# Patient Record
Sex: Female | Born: 1970 | Race: Black or African American | Hispanic: No | Smoking: Never smoker
Health system: Southern US, Community
[De-identification: ages and names within clinical notes are randomized; demographics above are authoritative.]

## PROBLEM LIST (undated history)

## (undated) DIAGNOSIS — Z90711 Acquired absence of uterus with remaining cervical stump: Secondary | ICD-10-CM

## (undated) HISTORY — PX: TOTAL ABDOMINAL HYSTERECTOMY: SHX209

## (undated) HISTORY — DX: Acquired absence of uterus with remaining cervical stump: Z90.711

## (undated) HISTORY — PX: ABDOMINAL HYSTERECTOMY: SHX81

---

## 1998-06-04 ENCOUNTER — Other Ambulatory Visit: Admission: RE | Admit: 1998-06-04 | Discharge: 1998-06-04 | Payer: Self-pay | Admitting: *Deleted

## 1999-06-15 ENCOUNTER — Other Ambulatory Visit: Admission: RE | Admit: 1999-06-15 | Discharge: 1999-06-15 | Payer: Self-pay | Admitting: Obstetrics and Gynecology

## 1999-08-24 ENCOUNTER — Encounter: Payer: Self-pay | Admitting: *Deleted

## 1999-08-24 ENCOUNTER — Ambulatory Visit (HOSPITAL_COMMUNITY): Admission: RE | Admit: 1999-08-24 | Discharge: 1999-08-24 | Payer: Self-pay | Admitting: *Deleted

## 2000-03-23 ENCOUNTER — Inpatient Hospital Stay (HOSPITAL_COMMUNITY): Admission: AD | Admit: 2000-03-23 | Discharge: 2000-03-23 | Payer: Self-pay | Admitting: Obstetrics and Gynecology

## 2000-03-26 ENCOUNTER — Inpatient Hospital Stay (HOSPITAL_COMMUNITY): Admission: AD | Admit: 2000-03-26 | Discharge: 2000-03-29 | Payer: Self-pay | Admitting: Obstetrics and Gynecology

## 2000-05-08 ENCOUNTER — Other Ambulatory Visit: Admission: RE | Admit: 2000-05-08 | Discharge: 2000-05-08 | Payer: Self-pay | Admitting: Obstetrics and Gynecology

## 2001-07-02 ENCOUNTER — Other Ambulatory Visit: Admission: RE | Admit: 2001-07-02 | Discharge: 2001-07-02 | Payer: Self-pay | Admitting: Obstetrics and Gynecology

## 2002-08-08 ENCOUNTER — Other Ambulatory Visit: Admission: RE | Admit: 2002-08-08 | Discharge: 2002-08-08 | Payer: Self-pay | Admitting: Obstetrics and Gynecology

## 2003-10-15 ENCOUNTER — Other Ambulatory Visit: Admission: RE | Admit: 2003-10-15 | Discharge: 2003-10-15 | Payer: Self-pay | Admitting: Obstetrics and Gynecology

## 2004-10-15 ENCOUNTER — Other Ambulatory Visit: Admission: RE | Admit: 2004-10-15 | Discharge: 2004-10-15 | Payer: Self-pay | Admitting: Obstetrics and Gynecology

## 2005-11-08 ENCOUNTER — Other Ambulatory Visit: Admission: RE | Admit: 2005-11-08 | Discharge: 2005-11-08 | Payer: Self-pay | Admitting: Obstetrics and Gynecology

## 2007-02-14 ENCOUNTER — Other Ambulatory Visit: Admission: RE | Admit: 2007-02-14 | Discharge: 2007-02-14 | Payer: Self-pay | Admitting: Obstetrics and Gynecology

## 2007-08-14 ENCOUNTER — Other Ambulatory Visit: Admission: RE | Admit: 2007-08-14 | Discharge: 2007-08-14 | Payer: Self-pay | Admitting: Nurse Practitioner

## 2008-02-13 ENCOUNTER — Other Ambulatory Visit: Admission: RE | Admit: 2008-02-13 | Discharge: 2008-02-13 | Payer: Self-pay | Admitting: Obstetrics and Gynecology

## 2008-03-04 ENCOUNTER — Encounter: Admission: RE | Admit: 2008-03-04 | Discharge: 2008-03-04 | Payer: Self-pay | Admitting: Obstetrics and Gynecology

## 2009-06-11 ENCOUNTER — Other Ambulatory Visit: Admission: RE | Admit: 2009-06-11 | Discharge: 2009-06-11 | Payer: Self-pay | Admitting: Obstetrics and Gynecology

## 2009-10-05 ENCOUNTER — Emergency Department (HOSPITAL_COMMUNITY): Admission: EM | Admit: 2009-10-05 | Discharge: 2009-10-06 | Payer: Self-pay | Admitting: Emergency Medicine

## 2009-10-05 ENCOUNTER — Encounter: Admission: RE | Admit: 2009-10-05 | Discharge: 2009-10-05 | Payer: Self-pay | Admitting: Family Medicine

## 2009-10-09 ENCOUNTER — Encounter (INDEPENDENT_AMBULATORY_CARE_PROVIDER_SITE_OTHER): Payer: Self-pay | Admitting: Obstetrics and Gynecology

## 2009-10-09 ENCOUNTER — Inpatient Hospital Stay (HOSPITAL_COMMUNITY): Admission: RE | Admit: 2009-10-09 | Discharge: 2009-10-11 | Payer: Self-pay | Admitting: Obstetrics and Gynecology

## 2010-06-05 LAB — BASIC METABOLIC PANEL
BUN: 4 mg/dL — ABNORMAL LOW (ref 6–23)
CO2: 26 mEq/L (ref 19–32)
Calcium: 8.8 mg/dL (ref 8.4–10.5)
Chloride: 109 mEq/L (ref 96–112)
Creatinine, Ser: 0.99 mg/dL (ref 0.4–1.2)
GFR calc Af Amer: >60 mL/min (ref >60)
GFR calc non Af Amer: >60 mL/min (ref >60)
Glucose, Bld: 89 mg/dL (ref 70–99)
Potassium: 3.9 mEq/L (ref 3.5–5.1)
Sodium: 141 mEq/L (ref 135–145)

## 2010-06-05 LAB — URINALYSIS, ROUTINE W REFLEX MICROSCOPIC
Bilirubin Urine: NEGATIVE
Bilirubin Urine: NEGATIVE
Glucose, UA: NEGATIVE mg/dL
Hgb urine dipstick: NEGATIVE
Hgb urine dipstick: NEGATIVE
Ketones, ur: NEGATIVE mg/dL
Specific Gravity, Urine: >1.046 — ABNORMAL HIGH (ref 1.005–1.030)
Urobilinogen, UA: 1 mg/dL (ref 0.0–1.0)
pH: 6.5 (ref 5.0–8.0)
pH: 8.5 — ABNORMAL HIGH (ref 5.0–8.0)

## 2010-06-05 LAB — POCT I-STAT, CHEM 8
Creatinine, Ser: 1.2 mg/dL (ref 0.4–1.2)
Glucose, Bld: 113 mg/dL — ABNORMAL HIGH (ref 70–99)
Hemoglobin: 12.6 g/dL (ref 12.0–15.0)
Potassium: 4 mEq/L (ref 3.5–5.1)

## 2010-06-05 LAB — CBC
HCT: 28.3 % — ABNORMAL LOW (ref 36.0–46.0)
HCT: 34.4 % — ABNORMAL LOW (ref 36.0–46.0)
HCT: 36.4 % (ref 36.0–46.0)
Hemoglobin: 11.9 g/dL — ABNORMAL LOW (ref 12.0–15.0)
Hemoglobin: 12.5 g/dL (ref 12.0–15.0)
Hemoglobin: 9.5 g/dL — ABNORMAL LOW (ref 12.0–15.0)
MCH: 28.9 pg (ref 26.0–34.0)
MCHC: 33.5 g/dL (ref 30.0–36.0)
MCHC: 34.3 g/dL (ref 30.0–36.0)
MCV: 83.5 fL (ref 78.0–100.0)
MCV: 84.3 fL (ref 78.0–100.0)
MCV: 85 fL (ref 78.0–100.0)
Platelets: 12 10*3/uL — CL (ref 150–400)
Platelets: 242 10*3/uL (ref 150–400)
RBC: 4.12 MIL/uL (ref 3.87–5.11)
RBC: 4.32 MIL/uL (ref 3.87–5.11)
RDW: 13.5 % (ref 11.5–15.5)
WBC: 2.2 10*3/uL — ABNORMAL LOW (ref 4.0–10.5)
WBC: 3.9 10*3/uL — ABNORMAL LOW (ref 4.0–10.5)

## 2010-08-06 NOTE — H&P (Signed)
[facility]  Patient:    Jessica Mccarthy, Jessica Mccarthy                       MRN: [id number] Adm. Date:  [id number] Disc. Date: [id number] Attending:  Shaune Spittle Dictator:   Wynelle Bourgeois, CNM                         History and Physical  DATE OF BIRTH:                [birth year]  HISTORY OF PRESENT ILLNESS:   Jessica Mccarthy is a 40 year old, G2, para 0-0-1-0, at 41-4/7 weeks who presents for induction of labor for postdatism.  She reports rare uterine contractions and no leaking or bleeding, and positive fetal movement.  Pregnancy has been followed by the certified nurse midwife service and has been remarkable for:  1. Positive group B strep.  2. Obesity. 3. Rubella nonimmune.  4. First-trimester bleeding.  5. Condyloma.  PRENATAL LABORATORIES:        Hemoglobin 12.8, hematocrit 37.2, platelets 385. Blood type B positive, antibody screen negative, sickle cell negative, RPR nonreactive, rubella nonimmune.  HBsAg negative, HIV nonreactive.  Glucose challenge within normal limits.  Pap test within normal limits.  Gonorrhea negative, Chlamydia negative.  AFP, free beta within normal limits.  OBSTETRIC HISTORY:            Remarkable for an elective abortion in 1993 at [redacted] weeks gestation with no complications.  PAST MEDICAL HISTORY:         Remarkable for an abnormal Pap smear in 1995 with normal repeated Pap smears.  She reports rare Candida infections.  She was diagnosed with stomach ulcer in 1995 and treated with Tagamet x 1 month. She has a history of broken right index finger in May 2001 treated with immobilization.  PAST SURGICAL HISTORY:        Remarkable for wisdom teeth removed in 1998, elective abortion in 1993, and vulvar biopsy in 1998.  FAMILY HISTORY:               Remarkable for maternal grandmother with hypertension, mother with varicosities.  Genetic history is remarkable for father of the babys first cousin who had twins with  stillbirths.  SOCIAL HISTORY:               Patient is married to [employer] who is involved and supportive.  She is of the episcopal faith.  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  She denies any alcohol, tobacco, or drug use.  PHYSICAL EXAMINATION:  VITAL SIGNS:                  Stable.  HEENT:                        Within normal limits.  NECK:                         Thyroid normal, not enlarged.  Neck supple.  CHEST:                        Clear to auscultation bilaterally.  HEART:                        Regular rate and rhythm.  No  murmur.  BREASTS:                      Soft, nontender.  No masses.  ABDOMEN:                      Gravid at 40 cm, vertex to [place].  EFM shows heart rate of 130s to 140s with accelerations to 150s and 160s, no decelerations.  A few mild uterine contractions are noted.  PELVIC:                       Cervical exam not performed at this time secondary to nursing admission.  EXTREMITIES:                  Within normal limits.  BACK:                         Normal.  ASSESSMENT:                   1. Intrauterine pregnancy at 41-4/7 weeks.                               2. Post dates, induction of labor, elective.                               3. Group B streptococcus positive.  PLAN:                         1. Admit to birthing suites, Dr. Pennie Rushing                                  notified.                               2. Routine certified nurse midwife orders.                               3. Cytotec 25 mcg per vagina q.4h., then Pitocin                                  in a.m.                               4. Group B streptococcus prophylaxis with                                  penicillin (no known drug allergies). DD:  03/26/00 TD:  03/26/00 Job: [id number]

## 2012-12-14 ENCOUNTER — Other Ambulatory Visit: Payer: Self-pay

## 2012-12-14 DIAGNOSIS — Z1231 Encounter for screening mammogram for malignant neoplasm of breast: Secondary | ICD-10-CM

## 2013-01-04 ENCOUNTER — Ambulatory Visit
Admission: RE | Admit: 2013-01-04 | Discharge: 2013-01-04 | Disposition: A | Discharge status: Home / Self Care | Payer: BC Managed Care – PPO | Source: Ambulatory Visit

## 2013-01-04 DIAGNOSIS — Z1231 Encounter for screening mammogram for malignant neoplasm of breast: Secondary | ICD-10-CM

## 2013-08-09 ENCOUNTER — Ambulatory Visit (INDEPENDENT_AMBULATORY_CARE_PROVIDER_SITE_OTHER): Payer: BC Managed Care – PPO | Admitting: Family Medicine

## 2013-08-09 VITALS — BP 122/82 | HR 67 | Temp 98.2°F | Resp 18 | Ht 67.0 in | Wt 196.0 lb

## 2013-08-09 DIAGNOSIS — L239 Allergic contact dermatitis, unspecified cause: Secondary | ICD-10-CM

## 2013-08-09 DIAGNOSIS — L259 Unspecified contact dermatitis, unspecified cause: Secondary | ICD-10-CM

## 2013-08-09 MED ORDER — TRIAMCINOLONE ACETONIDE 0.1 % EX CREA: 1.0000 "application " | TOPICAL_CREAM | Freq: Three times a day (TID) | CUTANEOUS | Status: DC

## 2013-08-09 NOTE — Progress Notes (Signed)
This is a 43 year old woman who is originally from [place]. She comes in with a history of 3 episodes of bumpy, itchy, and dark and upper lip over the last several weeks. She says that she's changed her diet to wait carbohydrates and she's concentrating on fruits, tomatoes, and nuts.  Patient's had a problem with certain foods but that seemed to resolve as she got older.  Patient notes that she has been taking Benadryl to control the itching.  Objective: Mild hyperpigmentation of the upper lip with papular changes. Oropharynx clear. No respiratory distress  Assessment: Inpatient section having a problem with cashews.  Plan: Clinic notes from her diet for the next week and then rechallenged after the triamcinolone treatment that we called in.  Signed, Robyn Haber

## 2013-09-19 ENCOUNTER — Ambulatory Visit (INDEPENDENT_AMBULATORY_CARE_PROVIDER_SITE_OTHER): Payer: BC Managed Care – PPO | Admitting: Physician Assistant

## 2013-09-19 VITALS — BP 102/57 | HR 70 | Temp 98.0°F | Resp 18 | Ht 67.5 in | Wt 199.0 lb

## 2013-09-19 DIAGNOSIS — T7840XA Allergy, unspecified, initial encounter: Secondary | ICD-10-CM

## 2013-09-19 DIAGNOSIS — R21 Rash and other nonspecific skin eruption: Secondary | ICD-10-CM

## 2013-09-19 MED ORDER — CETIRIZINE HCL 10 MG PO TABS: 10.0000 mg | ORAL_TABLET | Freq: Every day | ORAL | Status: DC

## 2013-09-19 NOTE — Patient Instructions (Signed)
Zyrtec (cetirizine) daily in the morning and Benadryl 25-50 mg

## 2013-09-19 NOTE — Progress Notes (Signed)
   Subjective:    Patient ID: Jessica Mccarthy, female    DOB: 08/01/1970, 43 y.o.   MRN: [id number]  HPI 43 year old female presents for evaluation of acute onset of erythematous bumps on her chest. States she first noticed them this morning and so she came in immediately for evaluation. She had a similar reaction last month with lip swelling and bumps. Was prescribed TAC cream to take. She does have an appointment with an allergist on 7/20. Does admit that the bumps on her chest seem to be improving as the day progresses.  No new medications, foods, soaps, detergents, or lotions Denies any hx of allergies.   No OTC treatments tried yet.     Review of Systems  Constitutional: Negative for fever and chills.  Gastrointestinal: Negative for nausea and vomiting.  Skin: Positive for rash.  Neurological: Negative for headaches.       Objective:   Physical Exam  Constitutional: She is oriented to person, place, and time. She appears well-developed and well-nourished.  HENT:  Head: Normocephalic and atraumatic.  Right Ear: External ear normal.  Left Ear: External ear normal.  Eyes: Conjunctivae are normal.  Neck: Normal range of motion.  Cardiovascular: Normal rate.   Pulmonary/Chest: Effort normal.  Neurological: She is alert and oriented to person, place, and time.  Skin:     Psychiatric: She has a normal mood and affect. Her behavior is normal. Judgment and thought content normal.          Assessment & Plan:  Allergic reaction, initial encounter - Plan: cetirizine (ZYRTEC) 10 MG tablet  Rash and nonspecific skin eruption  Limited, local, allergic reaction. Will treat conservatively with TAC cream and antihistamines. Patient has rx at home for TAC cream. Start Zyrtec daily in the morning and Benadryl 25-50 mg at bedtime. Consider prednisone taper if symptoms become more widespread.  F/u with allergist as planned. RTC precautions discussed.

## 2013-12-30 DIAGNOSIS — L7211 Pilar cyst: Secondary | ICD-10-CM | POA: Insufficient documentation

## 2013-12-30 DIAGNOSIS — L669 Cicatricial alopecia, unspecified: Secondary | ICD-10-CM | POA: Insufficient documentation

## 2014-02-19 ENCOUNTER — Other Ambulatory Visit: Payer: Self-pay

## 2014-02-19 DIAGNOSIS — Z1231 Encounter for screening mammogram for malignant neoplasm of breast: Secondary | ICD-10-CM

## 2014-03-04 ENCOUNTER — Ambulatory Visit
Admission: RE | Admit: 2014-03-04 | Discharge: 2014-03-04 | Disposition: A | Discharge status: Home / Self Care | Payer: BC Managed Care – PPO | Source: Ambulatory Visit

## 2014-03-04 ENCOUNTER — Encounter (INDEPENDENT_AMBULATORY_CARE_PROVIDER_SITE_OTHER): Payer: Self-pay

## 2014-03-04 DIAGNOSIS — Z1231 Encounter for screening mammogram for malignant neoplasm of breast: Secondary | ICD-10-CM

## 2014-12-17 ENCOUNTER — Telehealth: Payer: Self-pay

## 2014-12-17 NOTE — Telephone Encounter (Signed)
Spoke with patient. The only records related to rash in breast area were from July 2015. She states that's not what she needs. The rash she's referring to was "under" the breast, not on top. Patient's paper chart from 2011 was pulled and I did not see any records related to breast rash. She understood and did not request any records.

## 2014-12-17 NOTE — Telephone Encounter (Signed)
Patient left voicemail today at 11:46am requesting a call back. She wants to know if we have records from approximately 2 years ago related to a breast rash. If we have records, she needs copies. Please call back at [phone].

## 2015-03-09 ENCOUNTER — Other Ambulatory Visit: Payer: Self-pay

## 2015-03-09 DIAGNOSIS — Z1231 Encounter for screening mammogram for malignant neoplasm of breast: Secondary | ICD-10-CM

## 2015-03-27 ENCOUNTER — Ambulatory Visit
Admission: RE | Admit: 2015-03-27 | Discharge: 2015-03-27 | Disposition: A | Discharge status: Home / Self Care | Payer: BC Managed Care – PPO | Source: Ambulatory Visit

## 2015-03-27 DIAGNOSIS — Z1231 Encounter for screening mammogram for malignant neoplasm of breast: Secondary | ICD-10-CM

## 2015-09-21 ENCOUNTER — Ambulatory Visit (INDEPENDENT_AMBULATORY_CARE_PROVIDER_SITE_OTHER): Payer: BC Managed Care – PPO

## 2015-09-21 ENCOUNTER — Ambulatory Visit (INDEPENDENT_AMBULATORY_CARE_PROVIDER_SITE_OTHER): Payer: BC Managed Care – PPO | Admitting: Family Medicine

## 2015-09-21 VITALS — BP 108/64 | HR 88 | Temp 98.3°F | Resp 18 | Ht 67.72 in | Wt 223.0 lb

## 2015-09-21 DIAGNOSIS — S90212A Contusion of left great toe with damage to nail, initial encounter: Secondary | ICD-10-CM | POA: Diagnosis not present

## 2015-09-21 DIAGNOSIS — S99922A Unspecified injury of left foot, initial encounter: Secondary | ICD-10-CM

## 2015-09-21 NOTE — Patient Instructions (Addendum)
Take 400mg  of Ibuprofen every six hours as needed for pain.  Wear post op shoe for one week.  Come back to clinic as needed.     IF you received an x-ray today, you will receive an invoice from [facility]. Please contact [facility] at [phone] with questions or concerns regarding your invoice.   IF you received labwork today, you will receive an invoice from [employer]. Please contact [facility] at [phone] with questions or concerns regarding your invoice.   Our billing staff will not be able to assist you with questions regarding bills from these companies.  You will be contacted with the lab results as soon as they are available. The fastest way to get your results is to activate your My Chart account. Instructions are located on the last page of this paperwork. If you have not heard from [place] regarding the results in 2 weeks, please contact this office.

## 2015-09-21 NOTE — Progress Notes (Addendum)
Subjective:    Patient ID: Jessica Mccarthy, female    DOB: 1971-03-17, 45 y.o.   MRN: [id number] By signing my name below, I, Judithe Modest, attest that this documentation has been prepared under the direction and in the presence of Delman Cheadle, MD. Electronically Signed: Judithe Modest, ER Scribe. 09/21/2015. 2:38 PM.  Chief Complaint  Patient presents with  . Toe Injury    right great toe, last night    HPI HPI Comments: Jessica Mccarthy is a 45 y.o. female who presents to [facility] complaining of a left first toe injury after hitting her toe on a watermelon. Her toenail is partially pealed up from the bed. She is able to move the toe a little  Past Medical History  Diagnosis Date  . S/P partial hysterectomy    No Known Allergies Current Outpatient Prescriptions on File Prior to Visit  Medication Sig Dispense Refill  . triamcinolone cream (KENALOG) 0.1 % Apply 1 application topically 3 (three) times daily. 60 g 0  . cetirizine (ZYRTEC) 10 MG tablet Take 1 tablet (10 mg total) by mouth daily. (Patient not taking: Reported on 09/21/2015) 90 tablet 1   No current facility-administered medications on file prior to visit.    Review of Systems  Constitutional: Negative for fever and chills.  Cardiovascular: Negative for leg swelling.  Musculoskeletal: Positive for joint swelling, arthralgias and gait problem.  Skin: Negative for color change, rash and wound.  Neurological: Negative for weakness and numbness.  Hematological: Does not bruise/bleed easily.      Objective:  Blood pressure 108/64, pulse 88, temperature 98.3 F (36.8 C), resp. rate 18, height 5' 7.72" (1.72 m), weight 223 lb (101.152 kg), SpO2 100 %.  Physical Exam  Constitutional: She is oriented to person, place, and time. She appears well-developed and well-nourished. No distress.  HENT:  Head: Normocephalic and atraumatic.  Eyes: Pupils are equal, round, and reactive to light.  Neck: Neck supple.  Cardiovascular:  Normal rate.   Pulmonary/Chest: Effort normal. No respiratory distress.  Musculoskeletal: Normal range of motion.  Neurological: She is alert and oriented to person, place, and time. Coordination normal.  Skin: Skin is warm and dry. She is not diaphoretic.  Non-tender over the left first metatarsal on plantar surface. Severe tenderness over the left first IP joint. Able to move the left MTP joint but minimal movement at the IP joint.  Psychiatric: She has a normal mood and affect. Her behavior is normal.  Nursing note and vitals reviewed.    Dg Toe Great Left  09/21/2015  CLINICAL DATA:  Toe injury, jammed last night.  Pain at PIP joint. EXAM: LEFT GREAT TOE COMPARISON:  None. FINDINGS: No acute bony abnormality. Specifically, no fracture, subluxation, or dislocation. Soft tissues are intact. IMPRESSION: No acute bony abnormality. Electronically Signed   By: Rolm Baptise M.D.   On: 09/21/2015 14:50    Assessment & Plan:   1. Toe injury, left, initial encounter   X-ray reassuring normal - jammed toe. Recommend buddy tape and placed in a post-op shoe. Toenail lifted off of base and cracked with abnml nail bed beneath and quite painful so was removed.  Recheck in 7-10d.  Orders Placed This Encounter  Procedures  . DG Toe Great Left    Standing Status: Future     Number of Occurrences: 1     Standing Expiration Date: 09/20/2016    Order Specific Question:  Reason for Exam (SYMPTOM  OR DIAGNOSIS REQUIRED)  Answer:  jammed last night, very tender and no mvmnt at IP joint, nail lifted off    Order Specific Question:  Is the patient pregnant?    Answer:  No    Order Specific Question:  Preferred imaging location?    Answer:  External    I personally performed the services described in this documentation, which was scribed in my presence. The recorded information has been reviewed and considered, and addended by me as needed.   Delman Cheadle, M.D.  [facility] [address] [place], [place] [address] [phone] phone [phone] fax  09/21/2015 7:52 PM

## 2015-09-21 NOTE — Progress Notes (Signed)
PROCEDURE NOTE: Ingrown Toenail Removal  Verbal Consent Obtained. Right hallux cleaned with betadine, then digital block with 5cc of 0.5% Marcaine. Sterile prep and drape. Entire toenail lifted and excised. Proximal aspect of nail bed explored revealing no nail remnants. Ingrown tissue debrided. Excess skin tissue overlying nailbed also debrided. No active bleeding. Xeroform dressing applied. Cleansed and dressed. Wound care instructions including precautions reviewed with patient.  Tenna Delaine, PA-C  [facility] 09/21/2015 4:15 PM

## 2015-09-30 ENCOUNTER — Ambulatory Visit (INDEPENDENT_AMBULATORY_CARE_PROVIDER_SITE_OTHER): Payer: BC Managed Care – PPO | Admitting: Physician Assistant

## 2015-09-30 VITALS — BP 112/68 | HR 82 | Temp 98.3°F | Resp 16

## 2015-09-30 DIAGNOSIS — S99922D Unspecified injury of left foot, subsequent encounter: Secondary | ICD-10-CM

## 2015-09-30 NOTE — Patient Instructions (Addendum)
Fingernail or Toenail Removal, Care After SEEK MEDICAL CARE IF:  You have increased redness or pain at your nail area.  You have increased fluid, blood, or pus coming from your nail area.  There is a bad smell coming from the dressing.  You have a fever.  Your swelling gets worse, or you have swelling that spreads from your finger to your hand or from your toe to your foot.  You have worsening redness that spreads from your finger to your hand or from your toe up to your foot.  Your finger or toe looks blue or black.   Make sure you soak toenail with regular soap and warm water 2-3 times a day. Use bandaid at night or while outside for the next few weeks. If xeroform falls off that is okay but try to keep it on there as long as possible. You can even cut it to form the shape of your nail as your new nail comes in. Thank you for letting me participate in your health and well being.    IF you received an x-ray today, you will receive an invoice from [facility]. Please contact [facility] at [phone] with questions or concerns regarding your invoice.   IF you received labwork today, you will receive an invoice from [employer]. Please contact [facility] at [phone] with questions or concerns regarding your invoice.   Our billing staff will not be able to assist you with questions regarding bills from these companies.  You will be contacted with the lab results as soon as they are available. The fastest way to get your results is to activate your My Chart account. Instructions are located on the last page of this paperwork. If you have not heard from [place] regarding the results in 2 weeks, please contact this office.

## 2015-09-30 NOTE — Progress Notes (Signed)
   Jessica Mccarthy  MRN: [id number] DOB: 06-17-1970  Subjective:  Jessica Mccarthy is a 45 y.o. female seen in office today for follow up on left first toenail removal. Pt was seen on 09/21/15 after hitting her left first toe on a watermelon. The toenail had peeled away from the nailbed prior to presentation. Toenail removal procedure was perfomed and xeroform dressing was applied.Pt was given post op shoe to wear for one week and given wound care instructions. Told to follow up in one week.  Pt has no complaints today. Does note that she has not been washing the toe and has kept the bandage on since the 09/21/15 visit. Has associated tenderness when walking or applying direct pressure to left first toe. Denies fever, pus drainage, or redness.   Review of Systems  Constitutional: Negative for chills.  Musculoskeletal: Positive for gait problem (does note that her gait is being affected becuase she does not want to bear full weight on left foot becuase the left first toe is stil tender).    There are no active problems to display for this patient.   Current Outpatient Prescriptions on File Prior to Visit  Medication Sig Dispense Refill  . cetirizine (ZYRTEC) 10 MG tablet Take 1 tablet (10 mg total) by mouth daily. 90 tablet 1  . triamcinolone cream (KENALOG) 0.1 % Apply 1 application topically 3 (three) times daily. 60 g 0   No current facility-administered medications on file prior to visit.    No Known Allergies  Objective:  BP 112/68 mmHg  Pulse 82  Temp(Src) 98.3 F (36.8 C)  Resp 16  SpO2 100%  Physical Exam  Constitutional: She is oriented to person, place, and time and well-developed, well-nourished, and in no distress.  Pulmonary/Chest: Effort normal.  Musculoskeletal:       Feet:  Neurological: She is alert and oriented to person, place, and time.  Skin: Skin is warm and dry.  Vitals reviewed.   Bandage overlying left hallux was removed. Left hallux was soaked in soap and  warm water for approximately five minutes then dried with a 4x4 gauze.   Assessment and Plan :  1. Toe injury, left, subsequent encounter -Pt instructed to soak toenail in regular soap and warm water 2-3 times a day. Instructed that if xeroform falls off that is okay but do not intentionally pull it off. Pt can even cut it to form the shape of nail as new nail begins to form.  -Also instructed to start bearing weight on left foot and avoid making changes in her gait that allow her to not put pressure on the left foot. She has been educated that making such gait changes can increase risk of potential issues in lower extremities.   Tenna Delaine PA-C  [facility] 09/30/2015 2:12 PM

## 2016-03-18 ENCOUNTER — Ambulatory Visit (INDEPENDENT_AMBULATORY_CARE_PROVIDER_SITE_OTHER): Payer: BC Managed Care – PPO | Admitting: Physician Assistant

## 2016-03-18 VITALS — BP 114/72 | HR 95 | Temp 99.0°F | Resp 18 | Ht 67.72 in | Wt 224.0 lb

## 2016-03-18 DIAGNOSIS — Z1329 Encounter for screening for other suspected endocrine disorder: Secondary | ICD-10-CM

## 2016-03-18 DIAGNOSIS — Z1389 Encounter for screening for other disorder: Secondary | ICD-10-CM

## 2016-03-18 DIAGNOSIS — Z130 Encounter for screening for diseases of the blood and blood-forming organs and certain disorders involving the immune mechanism: Secondary | ICD-10-CM | POA: Diagnosis not present

## 2016-03-18 DIAGNOSIS — B372 Candidiasis of skin and nail: Secondary | ICD-10-CM

## 2016-03-18 DIAGNOSIS — Z0000 Encounter for general adult medical examination without abnormal findings: Secondary | ICD-10-CM | POA: Diagnosis not present

## 2016-03-18 DIAGNOSIS — Z13220 Encounter for screening for lipoid disorders: Secondary | ICD-10-CM | POA: Diagnosis not present

## 2016-03-18 DIAGNOSIS — Z13228 Encounter for screening for other metabolic disorders: Secondary | ICD-10-CM

## 2016-03-18 DIAGNOSIS — Z114 Encounter for screening for human immunodeficiency virus [HIV]: Secondary | ICD-10-CM

## 2016-03-18 LAB — POCT URINALYSIS DIP (MANUAL ENTRY)
BILIRUBIN UA: NEGATIVE
BILIRUBIN UA: NEGATIVE
Blood, UA: NEGATIVE
GLUCOSE UA: NEGATIVE
LEUKOCYTES UA: NEGATIVE
Nitrite, UA: NEGATIVE
Protein Ur, POC: NEGATIVE
Spec Grav, UA: 1.02
Urobilinogen, UA: 0.2
pH, UA: 6

## 2016-03-18 MED ORDER — NYSTATIN 100000 UNIT/GM EX POWD: Freq: Four times a day (QID) | CUTANEOUS | 1 refills | Status: DC

## 2016-03-18 NOTE — Progress Notes (Signed)
Patient ID: Jessica Mccarthy, female    DOB: [birth year], 45 y.o.   MRN: [id number]  PCP: No PCP Per Patient  Chief Complaint  Patient presents with  . Annual Exam    Subjective:   Presents for [employer].   Cervical Cancer Screening: no longer indicated.  Breast Cancer Screening: 03/2015, scheduled 03/2015; monthly SBE. Colorectal Cancer Screening: not yet a candidate Bone Density Testing: not yet a candidate HIV Screening: not yet STI Screening: separated from her souse. Not currently sexually active. Low risk. Seasonal Influenza Vaccination: current Td/Tdap Vaccination: current by report Pneumococcal Vaccination: not yet a candidate Zoster Vaccination: not yet a candidate Frequency of Dental evaluation: Q6 months Frequency of Eye evaluation: annually, did not bring glasses today    Patient Active Problem List   Diagnosis Date Noted  . 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 S/P partial hysterectomy      Prior to Admission medications   Medication Sig Start Date End Date Taking? Authorizing Provider  amoxicillin (AMOXIL) 500 MG capsule  03/07/16   Historical Provider, MD    No Known Allergies  Past Surgical History:  Procedure Laterality Date  . ABDOMINAL HYSTERECTOMY     due to fibroids    Family History  Problem Relation Age of Onset  . Diabetes Mother   . Hypertension Maternal Grandmother     Social History   Social History  . Marital status: Legally Separated    Spouse name: N/A  . Number of children: 1  . Years of education: College   Occupational History  . 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 Smoking status: Never Smoker  . Smokeless tobacco: Never Used  . Alcohol use 0.0 - 0.6 oz/week  . Drug use: No  . Sexual activity: Yes    Partners: Male    Birth control/ protection: Surgical   Other Topics Concern  . None   Social History Narrative   Daughter  lives with her.   Married 1999. Legally separated 2017.       Review of Systems  Constitutional: Negative.   HENT: Positive for dental problem (crack in root of tooth. DDS is treating.).   Eyes: Negative.   Respiratory: Negative.   Cardiovascular: Negative.   Gastrointestinal: Negative.   Genitourinary: Negative.   Musculoskeletal: Negative.   Skin: Positive for rash (Under RIGHT breast. Burns when showering, movement). Negative for color change, pallor and wound.  Neurological: Negative.   Psychiatric/Behavioral: Negative.         Objective:  Physical Exam  Constitutional: She is oriented to person, place, and time. Vital signs are normal. She appears well-developed and well-nourished. She is active and cooperative. No distress.  BP 114/72 (BP Location: Right Arm, Patient Position: Sitting, Cuff Size: Large)   Pulse 95   Temp 99 F (37.2 C) (Oral)   Resp 18   Ht 5' 7.72" (1.72 m)   Wt 224 lb (101.6 kg)   SpO2 100%   BMI 34.34 kg/m    HENT:  Head: Normocephalic and atraumatic.  Right Ear: Hearing, tympanic membrane, external ear and ear canal normal. No foreign bodies.  Left Ear: Hearing, tympanic membrane, external ear and ear canal normal. No foreign bodies.  Nose: Nose normal.  Mouth/Throat: Uvula is midline, oropharynx is clear and moist and mucous membranes are normal. No oral lesions. Normal dentition. No dental abscesses or uvula swelling. No oropharyngeal  exudate.  Eyes: Conjunctivae, EOM and lids are normal. Pupils are equal, round, and reactive to light. Right eye exhibits no discharge. Left eye exhibits no discharge. No scleral icterus.  Fundoscopic exam:      The right eye shows no arteriolar narrowing, no AV nicking, no exudate, no hemorrhage and no papilledema. The right eye shows red reflex.       The left eye shows no arteriolar narrowing, no AV nicking, no exudate, no hemorrhage and no papilledema. The left eye shows red reflex.  Neck: Trachea normal,  normal range of motion and full passive range of motion without pain. Neck supple. No spinous process tenderness and no muscular tenderness present. No thyroid mass and no thyromegaly present.  Cardiovascular: Normal rate, regular rhythm, normal heart sounds, intact distal pulses and normal pulses.   Pulmonary/Chest: Effort normal and breath sounds normal. Right breast exhibits no inverted nipple, no mass, no nipple discharge, no skin change and no tenderness. Left breast exhibits no inverted nipple, no mass, no nipple discharge, no skin change and no tenderness. Breasts are symmetrical.  Musculoskeletal: She exhibits no edema or tenderness.       Cervical back: Normal.       Thoracic back: Normal.       Lumbar back: Normal.  Lymphadenopathy:       Head (right side): No tonsillar, no preauricular, no posterior auricular and no occipital adenopathy present.       Head (left side): No tonsillar, no preauricular, no posterior auricular and no occipital adenopathy present.    She has no cervical adenopathy.       Right: No supraclavicular adenopathy present.       Left: No supraclavicular adenopathy present.  Neurological: She is alert and oriented to person, place, and time. She has normal strength and normal reflexes. No cranial nerve deficit. She exhibits normal muscle tone. Coordination and gait normal.  Skin: Skin is warm, dry and intact. Rash noted. Rash is maculopapular (under RIGHT breast, consistent with yeast dermatitis ). She is not diaphoretic. No cyanosis or erythema. Nails show no clubbing.  Psychiatric: She has a normal mood and affect. Her speech is normal and behavior is normal. Judgment and thought content normal.           Assessment & Plan:  1. Annual physical exam Age appropriate anticipatory guidance provided.  2. Screening for blood or protein in urine - Urine Microscopic - POCT urinalysis dipstick  3. Screening for deficiency anemia - CBC with  Differential/Platelet  4. Screening for thyroid disorder - TSH  5. Screening for metabolic disorder - Comprehensive metabolic panel  6. Screening for hyperlipidemia - Lipid panel  7. Screening for HIV (human immunodeficiency virus) - HIV antibody  8. Yeast dermatitis Supportive care.  Anticipatory guidance.  RTC if symptoms worsen/persist. - nystatin (NYSTATIN) powder; Apply topically 4 (four) times daily.  Dispense: 30 g; Refill: 1   Fara Chute, PA-C Physician Assistant[facility]Certified [facility] [place]

## 2016-03-18 NOTE — Patient Instructions (Addendum)
   IF you received an x-ray today, you will receive an invoice from [facility]. Please contact [facility] at [phone] with questions or concerns regarding your invoice.   IF you received labwork today, you will receive an invoice from [facility]. Please contact [facility] at [phone] with questions or concerns regarding your invoice.   Our billing staff will not be able to assist you with questions regarding bills from these companies.  You will be contacted with the lab results as soon as they are available. The fastest way to get your results is to activate your My Chart account. Instructions are located on the last page of this paperwork. If you have not heard from us regarding the results in 2 weeks, please contact this office.    Keeping You Healthy  Get These Tests 1. Blood Pressure- Have your blood pressure checked once a year by your health care provider.  Normal blood pressure is 120/80. 2. Weight- Have your body mass index (BMI) calculated to screen for obesity.  BMI is measure of body fat based on height and weight.  You can also calculate your own BMI at www.nhlbisupport.com/bmi/. 3. Cholesterol- Have your cholesterol checked every 5 years starting at age 20 then yearly starting at age 45. 4. Chlamydia, HIV, and other sexually transmitted diseases- Get screened every year until age 25, then within three months of each new sexual provider. 5. Pap Test - Every 1-5 years; discuss with your health care provider. 6. Mammogram- Every 1-2 years starting at age 40--50  Take these medicines  Calcium with Vitamin D-Your body needs 1200 mg of Calcium each day and 800-1000 IU of Vitamin D daily.  Your body can only absorb 500 mg of Calcium at a time so Calcium must be taken in 2 or 3 divided doses throughout the day.  Multivitamin with folic acid- Once daily if it is possible for you to become pregnant.  Get these Immunizations  Gardasil-Series of three doses;  prevents HPV related illness such as genital warts and cervical cancer.  Menactra-Single dose; prevents meningitis.  Tetanus shot- Every 10 years.  Flu shot-Every year.  Take these steps 1. Do not smoke-Your healthcare provider can help you quit.  For tips on how to quit go to www.smokefree.gov or call 1-800 QUITNOW. 2. Be physically active- Exercise 5 days a week for at least 30 minutes.  If you are not already physically active, start slow and gradually work up to 30 minutes of moderate physical activity.  Examples of moderate activity include walking briskly, dancing, swimming, bicycling, etc. 3. Breast Cancer- A self breast exam every month is important for early detection of breast cancer.  For more information and instruction on self breast exams, ask your healthcare provider or www.womenshealth.gov/faq/breast-self-exam.cfm. 4. Eat a healthy diet- Eat a variety of healthy foods such as fruits, vegetables, whole grains, low fat milk, low fat cheeses, yogurt, lean meats, poultry and fish, beans, nuts, tofu, etc.  For more information go to www. Thenutritionsource.org 5. Drink alcohol in moderation- Limit alcohol intake to one drink or less per day. Never drink and drive. 6. Depression- Your emotional health is as important as your physical health.  If you're feeling down or losing interest in things you normally enjoy please talk to your healthcare provider about being screened for depression. 7. Dental visit- Brush and floss your teeth twice daily; visit your dentist twice a year. 8. Eye doctor- Get an eye exam at least every 2 years. 9. Helmet   use- Always wear a helmet when riding a bicycle, motorcycle, rollerblading or skateboarding. 10. Safe sex- If you may be exposed to sexually transmitted infections, use a condom. 11. Seat belts- Seat belts can save your live; always wear one. 12. Smoke/Carbon Monoxide detectors- These detectors need to be installed on the appropriate level of your  home. Replace batteries at least once a year. 13. Skin cancer- When out in the sun please cover up and use sunscreen 15 SPF or higher. 14. Violence- If anyone is threatening or hurting you, please tell your healthcare provider.        

## 2016-03-19 LAB — CBC WITH DIFFERENTIAL/PLATELET
BASOS ABS: 0 10*3/uL (ref 0.0–0.2)
Basos: 0 %
EOS (ABSOLUTE): 0.1 10*3/uL (ref 0.0–0.4)
Eos: 1 %
HEMOGLOBIN: 13.7 g/dL (ref 11.1–15.9)
Hematocrit: 39.6 % (ref 34.0–46.6)
Immature Grans (Abs): 0 10*3/uL (ref 0.0–0.1)
Immature Granulocytes: 0 %
LYMPHS ABS: 1.4 10*3/uL (ref 0.7–3.1)
LYMPHS: 31 %
MCH: 28.5 pg (ref 26.6–33.0)
MCHC: 34.6 g/dL (ref 31.5–35.7)
MCV: 83 fL (ref 79–97)
MONOCYTES: 6 %
Monocytes Absolute: 0.3 10*3/uL (ref 0.1–0.9)
NEUTROS PCT: 62 %
Neutrophils Absolute: 2.6 10*3/uL (ref 1.4–7.0)
Platelets: 437 10*3/uL — ABNORMAL HIGH (ref 150–379)
RBC: 4.8 x10E6/uL (ref 3.77–5.28)
RDW: 13.2 % (ref 12.3–15.4)
WBC: 4.4 10*3/uL (ref 3.4–10.8)

## 2016-03-19 LAB — COMPREHENSIVE METABOLIC PANEL
ALBUMIN: 3.8 g/dL (ref 3.5–5.5)
ALK PHOS: 84 IU/L (ref 39–117)
ALT: 14 IU/L (ref 0–32)
AST: 15 IU/L (ref 0–40)
Albumin/Globulin Ratio: 1.2 (ref 1.2–2.2)
BUN/Creatinine Ratio: 8 — ABNORMAL LOW (ref 9–23)
BUN: 7 mg/dL (ref 6–24)
Bilirubin Total: 0.3 mg/dL (ref 0.0–1.2)
CO2: 26 mmol/L (ref 18–29)
CREATININE: 0.87 mg/dL (ref 0.57–1.00)
Calcium: 9.1 mg/dL (ref 8.7–10.2)
Chloride: 101 mmol/L (ref 96–106)
GFR calc Af Amer: 94 mL/min/{1.73_m2} (ref >59)
GFR calc non Af Amer: 82 mL/min/{1.73_m2} (ref >59)
GLUCOSE: 88 mg/dL (ref 65–99)
Globulin, Total: 3.3 g/dL (ref 1.5–4.5)
Potassium: 5 mmol/L (ref 3.5–5.2)
Sodium: 141 mmol/L (ref 134–144)
Total Protein: 7.1 g/dL (ref 6.0–8.5)

## 2016-03-19 LAB — LIPID PANEL
CHOLESTEROL TOTAL: 190 mg/dL (ref 100–199)
Chol/HDL Ratio: 3.2 ratio units (ref 0.0–4.4)
HDL: 59 mg/dL (ref >39)
LDL CALC: 119 mg/dL — AB (ref 0–99)
TRIGLYCERIDES: 62 mg/dL (ref 0–149)
VLDL CHOLESTEROL CAL: 12 mg/dL (ref 5–40)

## 2016-03-19 LAB — TSH: TSH: 2.27 u[IU]/mL (ref 0.450–4.500)

## 2016-03-19 LAB — URINALYSIS, MICROSCOPIC ONLY
BACTERIA UA: NONE SEEN
Casts: NONE SEEN /lpf
RBC, UA: NONE SEEN /hpf (ref 0–?)

## 2016-03-19 LAB — HIV ANTIBODY (ROUTINE TESTING W REFLEX): HIV Screen 4th Generation wRfx: NONREACTIVE

## 2016-03-20 ENCOUNTER — Encounter: Payer: Self-pay | Admitting: Physician Assistant

## 2016-03-27 ENCOUNTER — Encounter: Payer: Self-pay | Admitting: Physician Assistant

## 2016-05-19 ENCOUNTER — Other Ambulatory Visit: Payer: Self-pay | Admitting: Obstetrics and Gynecology

## 2016-05-19 DIAGNOSIS — Z1231 Encounter for screening mammogram for malignant neoplasm of breast: Secondary | ICD-10-CM

## 2016-06-10 ENCOUNTER — Ambulatory Visit
Admission: RE | Admit: 2016-06-10 | Discharge: 2016-06-10 | Disposition: A | Discharge status: Home / Self Care | Payer: BC Managed Care – PPO | Source: Ambulatory Visit | Attending: Obstetrics and Gynecology | Admitting: Obstetrics and Gynecology

## 2016-06-10 DIAGNOSIS — Z1231 Encounter for screening mammogram for malignant neoplasm of breast: Secondary | ICD-10-CM

## 2017-03-07 ENCOUNTER — Encounter: Payer: Self-pay | Admitting: Physician Assistant

## 2017-03-07 ENCOUNTER — Encounter (HOSPITAL_COMMUNITY): Payer: Self-pay

## 2017-03-07 ENCOUNTER — Ambulatory Visit (HOSPITAL_COMMUNITY)
Admission: RE | Admit: 2017-03-07 | Discharge: 2017-03-07 | Disposition: A | Discharge status: Home / Self Care | Payer: BC Managed Care – PPO | Source: Ambulatory Visit | Attending: Cardiovascular Disease | Admitting: Cardiovascular Disease

## 2017-03-07 ENCOUNTER — Other Ambulatory Visit: Payer: Self-pay

## 2017-03-07 ENCOUNTER — Ambulatory Visit: Payer: BC Managed Care – PPO | Admitting: Physician Assistant

## 2017-03-07 VITALS — BP 108/70 | HR 88 | Temp 98.6°F | Resp 18 | Ht 67.72 in | Wt 240.6 lb

## 2017-03-07 DIAGNOSIS — M7989 Other specified soft tissue disorders: Secondary | ICD-10-CM | POA: Diagnosis not present

## 2017-03-07 DIAGNOSIS — R000 Tachycardia, unspecified: Secondary | ICD-10-CM

## 2017-03-07 DIAGNOSIS — R21 Rash and other nonspecific skin eruption: Secondary | ICD-10-CM | POA: Diagnosis not present

## 2017-03-07 DIAGNOSIS — B372 Candidiasis of skin and nail: Secondary | ICD-10-CM

## 2017-03-07 LAB — D-DIMER, QUANTITATIVE: D-DIMER: 0.38 mg/L FEU (ref 0.00–0.49)

## 2017-03-07 MED ORDER — NYSTATIN 100000 UNIT/GM EX POWD: Freq: Three times a day (TID) | CUTANEOUS | 1 refills | Status: DC

## 2017-03-07 MED ORDER — FLUCONAZOLE 150 MG PO TABS: 150.0000 mg | ORAL_TABLET | ORAL | 0 refills | Status: AC

## 2017-03-07 NOTE — Patient Instructions (Addendum)
Your ultrasound is scheduled today at [place]. The office is located in the building where [employer] is. Please take the elevators up to the second floor and proceed to [address]. Arrive by 2:45pm.   Your rash is consistent with candidiasis.  I recommend applying nystatin 3 times a day.  I have also given you oral tablet to take once weekly for the next 2 weeks.  If your rash persist beyond 2 weeks, please return to clinic.  Try to keep this area as dry and Aridol as much as possible.  Below some information about candidiasis.  In terms of your lower leg swelling, we are going to send you for stat ultrasound to rule out any blood clot.  I have also collected a lab and should have that back in a few hours.  We will contact you with the results of these tests and discuss further treatment.  If you develop any chest pain, shortness of breath, or calf pain before 3pm, please go to the emergency room immediately.   Skin Yeast Infection Skin yeast infection is a condition in which there is an overgrowth of yeast (candida) that normally lives on the skin. This condition usually occurs in areas of the skin that are constantly warm and moist, such as the armpits or the groin. What are the causes? This condition is caused by a change in the normal balance of the yeast and bacteria that live on the skin. What increases the risk? This condition is more likely to develop in:  People who are obese.  Pregnant women.  Women who take birth control pills.  People who have diabetes.  People who take antibiotic medicines.  People who take steroid medicines.  People who are malnourished.  People who have a weak defense (immune) system.  People who are 44 years of age or older.  What are the signs or symptoms? Symptoms of this condition include:  A red, swollen area of the skin.  Bumps on the skin.  Itchiness.  How is this diagnosed? This condition is diagnosed with a medical history  and physical exam. Your health care provider may check for yeast by taking light scrapings of the skin to be viewed under a microscope. How is this treated? This condition is treated with medicine. Medicines may be prescribed or be available over-the-counter. The medicines may be:  Taken by mouth (orally).  Applied as a cream.  Follow these instructions at home:  Take or apply over-the-counter and prescription medicines only as told by your health care provider.  Eat more yogurt. This may help to keep your yeast infection from returning.  Maintain a healthy weight. If you need help losing weight, talk with your health care provider.  Keep your skin clean and dry.  If you have diabetes, keep your blood sugar under control. Contact a health care provider if:  Your symptoms go away and then return.  Your symptoms do not get better with treatment.  Your symptoms get worse.  Your rash spreads.  You have a fever or chills.  You have new symptoms.  You have new warmth or redness of your skin. This information is not intended to replace advice given to you by your health care provider. Make sure you discuss any questions you have with your health care provider. Document Released: 11/23/2010 Document Revised: 11/01/2015 Document Reviewed: 09/08/2014 Elsevier Interactive Patient Education  2018 [employer].    IF you received an x-ray today, you will receive  an Pharmacologist from [facility]. Please contact [facility] at [phone] with questions or concerns regarding your invoice.   IF you received labwork today, you will receive an invoice from [facility]. Please contact [facility] at [phone] with questions or concerns regarding your invoice.   Our billing staff will not be able to assist you with questions regarding bills from these companies.  You will be contacted with the lab results as soon as they are available. The fastest way to get your results is to  activate your My Chart account. Instructions are located on the last page of this paperwork. If you have not heard from [place] regarding the results in 2 weeks, please contact this office.

## 2017-03-07 NOTE — Progress Notes (Deleted)
Subjective:     Jessica Mccarthy is a 46 y.o. female who presents for evaluation of a rash involving the {body part:32401}. Rash started {1-10:13787} {time; units:19136} ago. Lesions are {color:5006}, and {text:16500} in texture. Rash {has/not:18111} changed over time. Rash {rash dc:16501}. Associated symptoms: {rash assoc:16502}. Patient denies: {rash assoc:19565}. Patient {has/not:18111} had contacts with similar rash. Patient {has/not:18111} had new exposures (soaps, lotions, laundry detergents, foods, medications, plants, insects or animals).  {Common ambulatory SmartLinks:19316}  Review of Systems {ros; complete:30496}    Objective:    BP 108/70   Pulse (!) 111   Temp 98.6 F (37 C) (Oral)   Resp 18   Ht 5' 7.72" (1.72 m)   Wt 240 lb 9.6 oz (109.1 kg)   SpO2 100%   BMI 36.89 kg/m  General:  {gen appearance:16600}  Skin:  {skin exam:30902::"normal"}     Assessment:    {derm diagnosis:16511}    Plan:    {HRCB:63845}

## 2017-03-07 NOTE — Progress Notes (Signed)
Jessica Mccarthy  MRN: [id number] DOB: [birth year]  Subjective:     Jessica Mccarthy is a 46 y.o. female who presents for evaluation of a rash involving the skin underneath bilateral breasts. Rash started 3 days ago. She was in [place] this past week and got really hot walking around and noticed it after that. Lesions are dark, and flat in texture. Rash has changed over time. Rash is pruritic. Associated symptoms: none. Patient denies: abdominal pain, arthralgia, congestion, cough, decrease in appetite, decrease in energy level, fever, headache, irritability, myalgia, nausea, sore throat and vomiting. Patient has not had contacts with similar rash. Patient has not had new exposures (soaps, lotions, laundry detergents, foods, medications, plants, insects or animals). Has tried topical nystatin powder for the past 2 days with no full relief.   Also would like to talk about lower feet swelling. Went to [place] this past week. Had swelling in both feet once she got of the 15 hour flight. It stayed the same while she was there. She got back yesterday and it is still present. The left foot is worse than the right.  She denies calf pain, swelling, redness, warmth, chest pain, and SOB. Has been drinking at least 2 bottles of 16 oz water a day. Denies use of hormones, recent hospitalizations, smoking, and hx of cancer. NO hx of DVT.   Review of Systems  Constitutional: Negative for activity change, appetite change and diaphoresis.  Respiratory: Negative for cough and chest tightness.   Cardiovascular: Negative for palpitations.  Gastrointestinal: Negative for abdominal pain, nausea and vomiting.  Neurological: Negative for dizziness and light-headedness.      Patient Active Problem List   Diagnosis Date Noted  . 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   No current facility-administered medications on file prior to visit.     No Known Allergies    Social History   Socioeconomic History  . Marital status: Legally Separated    Spouse name: Not on file  . Number of children: 1  . Years of education: College  . Highest education level: Not on file  Social Needs  . Financial resource strain: Not on file  . Food insecurity - worry: Not on file  . Food insecurity - inability: Not on file  . Transportation needs - medical: Not on file  . Transportation needs - non-medical: Not on file  Occupational History  . Occupation: Technical brewer: [employer]    Comment: [employer]  Tobacco Use  . Smoking status: Never Smoker  . Smokeless tobacco: Never Used  Substance and Sexual Activity  . Alcohol use: Yes    Alcohol/week: 0.0 - 0.6 oz  . Drug use: No  . Sexual activity: Yes    Partners: Male    Birth control/protection: Surgical  Other Topics Concern  . Not on file  Social History Narrative   Daughter lives with her.   Married 1999. Legally separated 2017.    Objective:  BP 108/70   Pulse 88   Temp 98.6 F (37 C) (Oral)   Resp 18   Ht 5' 7.72" (1.72 m)   Wt 240 lb 9.6 oz (109.1 kg)   SpO2 100%   BMI 36.89 kg/m   Physical Exam  Constitutional: She is oriented to person, place, and time and well-developed, well-nourished, and in no distress.  HENT:  Head: Normocephalic and atraumatic.  Eyes: Conjunctivae are normal.  Neck: Normal range of motion.  Cardiovascular: Regular rhythm and normal heart sounds. Tachycardia present.  Pulmonary/Chest: Effort normal.  Large breasts noted bilaterally.  Musculoskeletal:       Right lower leg: She exhibits edema (1+ edema to mid shin). She exhibits no tenderness.       Left lower leg: She exhibits edema (2 + pitting edema to knee). She exhibits no tenderness.  Left calf measures 50 cm Right calf measures 49.5 cm  Negative Homan's sign bilaterally. No redness or warmth noted bilaterally.     Neurological: She is alert and oriented to person, place, and time. Gait  normal.  Skin: Skin is warm and dry. Rash ( Erythematous macular rash with slight maceration of inframammary folds bilaterally, satellite lesions noted.) noted.     Psychiatric: Affect normal.  Vitals reviewed.   Pulse while ambulating was 114-116 bpm.  Pulse while sitting was 88 bpm.   EKG shows NSR with rate of 77 bpm. PR and QRS intervals within normal limits. Nonspecific inverted T wave noted in lead III. No prior EKG for comparison.    Assessment and Plan :  1. Swelling of lower leg Due to recent long airplane flight with new onset bilateral lower extremity edema (L>R), will obtain stat d-dimer and bilateral lower extremity ultrasound to rule out DVT and PE.Marland Kitchen  Patient was tachycardic when ambulating.  However, resting pulse is 88 bpm and EKG is normal except for nonspecific inverted T wave in lead III.  She denies any calf pain, chest pain, or shortness of breath.  She is well-appearing.  Stat ultrasound controlled for later this afternoon.  Patient informed that if she develops any chest pain, shortness of breath, or calf pain while awaiting to have the ultrasounds, to seek care immediately at the at the ED.  We will discuss further treatment plan once results return. - D-dimer, quantitative (not at [facility]) - VAS [place] LOWER EXTREMITY VENOUS (DVT); Future  2. Tachycardia - EKG 12-Lead  3. Rash and nonspecific skin eruption 4. Candidiasis, intertrigo Rash consistent with candidiasis.  Will treat accordingly.  Given educational material on skin yeast infection.  Encouraged patient to try and keep the affected area as dry as possible. - fluconazole (DIFLUCAN) 150 MG tablet; Take 1 tablet (150 mg total) by mouth once a week for 2 doses.  Dispense: 2 tablet; Refill: 0 - nystatin (NYSTATIN) powder; Apply topically 3 (three) times daily.  Dispense: 30 g; Refill: [place] PA-C  Primary Care at [place] 03/09/2017 9:06 PM

## 2017-03-07 NOTE — Progress Notes (Unsigned)
Today's lower extremity venous duplex is negative for DVT. There is a Baker's cyst behind both knees. Preliminary results given to [place].

## 2017-03-09 ENCOUNTER — Encounter: Payer: Self-pay | Admitting: Physician Assistant

## 2017-04-06 ENCOUNTER — Ambulatory Visit: Payer: BC Managed Care – PPO | Admitting: Physician Assistant

## 2017-04-06 ENCOUNTER — Encounter: Payer: Self-pay | Admitting: Physician Assistant

## 2017-04-06 ENCOUNTER — Other Ambulatory Visit: Payer: Self-pay

## 2017-04-06 VITALS — BP 118/72 | HR 98 | Temp 99.1°F | Resp 18 | Ht 66.73 in | Wt 236.8 lb

## 2017-04-06 DIAGNOSIS — R21 Rash and other nonspecific skin eruption: Secondary | ICD-10-CM | POA: Diagnosis not present

## 2017-04-06 DIAGNOSIS — B372 Candidiasis of skin and nail: Secondary | ICD-10-CM | POA: Diagnosis not present

## 2017-04-06 LAB — GLUCOSE, POCT (MANUAL RESULT ENTRY): POC GLUCOSE: 104 mg/dL — AB (ref 70–99)

## 2017-04-06 MED ORDER — FLUCONAZOLE 150 MG PO TABS: 150.0000 mg | ORAL_TABLET | ORAL | 0 refills | Status: DC

## 2017-04-06 MED ORDER — NYSTATIN 100000 UNIT/GM EX POWD: Freq: Three times a day (TID) | CUTANEOUS | 1 refills | Status: DC

## 2017-04-06 NOTE — Patient Instructions (Addendum)
I recommend purchasing desitin cream and applying this as a thick barrier first thing in the morning to the affected area. Once you get home, wash gently with water and dry sufficiently. Then apply nystatin powder before bed. Make sure you wear a gown at night and let everything air out. I have given two additional tablets for diflucan to take once a week. If you get warm or hot at work, go to the bathroom and air it out. If this worsens or does not go away after two weeks, return to office. Thank you for letting me participate in your health and well being.  Skin Yeast Infection Skin yeast infection is a condition in which there is an overgrowth of yeast (candida) that normally lives on the skin. This condition usually occurs in areas of the skin that are constantly warm and moist, such as the armpits or the groin. What are the causes? This condition is caused by a change in the normal balance of the yeast and bacteria that live on the skin. What increases the risk? This condition is more likely to develop in:  People who are obese.  Pregnant women.  Women who take birth control pills.  People who have diabetes.  People who take antibiotic medicines.  People who take steroid medicines.  People who are malnourished.  People who have a weak defense (immune) system.  People who are 17 years of age or older.  What are the signs or symptoms? Symptoms of this condition include:  A red, swollen area of the skin.  Bumps on the skin.  Itchiness.  How is this diagnosed? This condition is diagnosed with a medical history and physical exam. Your health care provider may check for yeast by taking light scrapings of the skin to be viewed under a microscope. How is this treated? This condition is treated with medicine. Medicines may be prescribed or be available over-the-counter. The medicines may be:  Taken by mouth (orally).  Applied as a cream.  Follow these instructions at  home:  Take or apply over-the-counter and prescription medicines only as told by your health care provider.  Eat more yogurt. This may help to keep your yeast infection from returning.  Maintain a healthy weight. If you need help losing weight, talk with your health care provider.  Keep your skin clean and dry.  If you have diabetes, keep your blood sugar under control. Contact a health care provider if:  Your symptoms go away and then return.  Your symptoms do not get better with treatment.  Your symptoms get worse.  Your rash spreads.  You have a fever or chills.  You have new symptoms.  You have new warmth or redness of your skin. This information is not intended to replace advice given to you by your health care provider. Make sure you discuss any questions you have with your health care provider. Document Released: 11/23/2010 Document Revised: 11/01/2015 Document Reviewed: 09/08/2014 Elsevier Interactive Patient Education  2018 [employer].    IF you received an x-ray today, you will receive an invoice from [facility]. Please contact [facility] at [phone] with questions or concerns regarding your invoice.   IF you received labwork today, you will receive an invoice from [facility]. Please contact [facility] at [phone] with questions or concerns regarding your invoice.   Our billing staff will not be able to assist you with questions regarding bills from these companies.  You will be contacted with the lab results as soon  as they are available. The fastest way to get your results is to activate your My Chart account. Instructions are located on the last page of this paperwork. If you have not heard from [place] regarding the results in 2 weeks, please contact this office.

## 2017-04-06 NOTE — Progress Notes (Signed)
Jessica Mccarthy  MRN: [id number] DOB: April 02, 1970  Subjective:  Jessica Mccarthy is a 47 y.o. female seen in office today for a chief complaint of follow-up on rash.  Patient was initially seen on 03/07/17 for rash underneath bilateral breasts.  It had started while she was in [place] on vacation.  It was pruritic.  Physical exam findings are consistent with intertrigo candidiasis, which she has a history of due to large breasts.  She was given Rx for nystatin powder and 2 tablets of Diflucan to take weekly.  Encouraged to follow-up if no improvement or symptoms worsen.  Today, patient notes that the rash has gotten a lot better underneath the breast and in between the breast but it has spread up her chest.  She does not have any more nystatin powder.  She has been trying tea tree oil and she thinks this is helping.  She has difficulty airing the area out due to the size of her breast.  Also, she commonly wears sweaters when she is at work and this does not help.  She does have a good fitting bra.  She denies fever, chills, diaphoresis, redness, warmth, and pain.  Has not had any recent new exposures to soaps, lotions, laundry detergent, medication, plants, or animals.  Review of Systems Per HPI   Patient Active Problem List   Diagnosis Date Noted  . 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 nystatin (NYSTATIN) powder Apply topically 3 (three) times daily. 30 g 1   No current facility-administered medications on file prior to visit.     No Known Allergies   Objective:  BP 118/72 (BP Location: Right Arm, Patient Position: Sitting, Cuff Size: Normal)   Pulse 98   Temp 99.1 F (37.3 C) (Oral)   Resp 18   Ht 5' 6.73" (1.695 m)   Wt 236 lb 12.8 oz (107.4 kg)   SpO2 100%   BMI 37.39 kg/m   Physical Exam  Constitutional: She is oriented to person, place, and time and well-developed, well-nourished, and in no  distress.  HENT:  Head: Normocephalic and atraumatic.  Eyes: Conjunctivae are normal.  Neck: Normal range of motion.  Pulmonary/Chest: Effort normal.  Large pendulous breasts noted.   Neurological: She is alert and oriented to person, place, and time. Gait normal.  Skin: Skin is warm and dry. Rash (Hyperpigmented scaly pactches with satelite lesions noted in inframammary folds and inferior aspect of intermammery cleft, supeior intermammary cleft with erythematous macular rash with slight maceration) noted.  Psychiatric: Affect normal.  Vitals reviewed.  Results for orders placed or performed in visit on 04/06/17 (from the past 48 hour(s))  POCT glucose (manual entry)     Status: Abnormal   Collection Time: 04/06/17  3:18 PM  Result Value Ref Range   POC Glucose 104 (A) 70 - 99 mg/dl    Assessment and Plan :  1. Rash and nonspecific skin eruption 2. Candidiasis, intertrigo Rash has appeared to improve in bilateral inframammary folds however it has progressed to superior aspect of intramammary cleft.  Point-of-care glucose 104.  Patient has extremely large breast and a very difficult time successfully getting the area dry.  Recommend over-the-counter barrier cream such as Desitin applied to the affected area daily.  Will also provide patient with a refill of nystatin powder.  Also recommend an additional 2 doses of weekly Diflucan.  Encouraged patient to try to air out  the area as much as possible.  Advised to return to clinic if symptoms worsen, do not improve, or as needed. - fluconazole (DIFLUCAN) 150 MG tablet; Take 1 tablet (150 mg total) by mouth once a week. Repeat if needed  Dispense: 2 tablet; Refill: 0 - POCT glucose (manual entry) - nystatin (NYSTATIN) powder; Apply topically 3 (three) times daily.  Dispense: 30 g; Refill: [place] PA-C  Primary Care at [facility] 04/06/2017 2:44 PM

## 2017-04-07 ENCOUNTER — Encounter: Payer: Self-pay | Admitting: Physician Assistant

## 2017-06-10 ENCOUNTER — Telehealth: Payer: Self-pay

## 2017-06-10 NOTE — Telephone Encounter (Signed)
Pt requested refill of a rash cream she was prescribed approximately three years ago.  That provider no longer at the office.  Pt has been seen by PA Timmothy Euler for rash recently.  OV notes recommend f/u if no improvement.  Appt scheduled for 03/25.

## 2017-06-12 ENCOUNTER — Other Ambulatory Visit: Payer: Self-pay

## 2017-06-12 ENCOUNTER — Ambulatory Visit: Payer: BC Managed Care – PPO | Admitting: Physician Assistant

## 2017-06-12 ENCOUNTER — Encounter: Payer: Self-pay | Admitting: Physician Assistant

## 2017-06-12 VITALS — BP 102/70 | HR 97 | Temp 99.0°F | Resp 18 | Ht 67.13 in | Wt 235.6 lb

## 2017-06-12 DIAGNOSIS — L309 Dermatitis, unspecified: Secondary | ICD-10-CM | POA: Diagnosis not present

## 2017-06-12 DIAGNOSIS — R21 Rash and other nonspecific skin eruption: Secondary | ICD-10-CM

## 2017-06-12 DIAGNOSIS — B372 Candidiasis of skin and nail: Secondary | ICD-10-CM | POA: Diagnosis not present

## 2017-06-12 MED ORDER — TRIAMCINOLONE ACETONIDE 0.1 % EX CREA: 1.0000 "application " | TOPICAL_CREAM | Freq: Two times a day (BID) | CUTANEOUS | 0 refills | Status: DC

## 2017-06-12 MED ORDER — TRIAMCINOLONE ACETONIDE 0.025 % EX OINT: 1.0000 "application " | TOPICAL_OINTMENT | Freq: Two times a day (BID) | CUTANEOUS | 0 refills | Status: DC

## 2017-06-12 NOTE — Progress Notes (Signed)
Jessica Mccarthy  MRN: [id number] DOB: 1970-11-19  Subjective:  Jessica Mccarthy is a 47 y.o. female seen in office today for a chief complaint of follow-up on rash and refill for medication.  Patient last seen on 04/06/17 for intertrigo.  She was given Rx for Diflucan, nystatin powder, and encouraged to buy over-the-counter Desitin cream.  Patient notes that it took quite some time but her symptoms finally improved.  She still has occasional itching in between the breast.  She started using triamcinolone cream that she had prescribed once for her lips and this is helped.  Would like a refill for triamcinolone cream.  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  Notes that if she eats spicy or peppery food she will have a breakout of  itchy bumps on her lips.  Typically she will use triamcinolone cream for about 3 days and this will resolve.  She only has to use this occasionally.  No other food allergies that she is aware of.  She denies difficulty breathing, shortness of breath, trouble swallowing, chest pain, nausea, vomiting, and abdominal pain.   Denies new medication use.  Review of Systems  Per HPI  Patient Active Problem List   Diagnosis Date Noted  . 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 nystatin (NYSTATIN) powder Apply topically 3 (three) times daily. 30 g 1  . triamcinolone cream (KENALOG) 0.1 % Apply 1 application topically 2 (two) times daily.    . fluconazole (DIFLUCAN) 150 MG tablet Take 1 tablet (150 mg total) by mouth once a week. Repeat if needed (Patient not taking: Reported on 06/12/2017) 2 tablet 0   No current facility-administered medications on file prior to visit.     No Known Allergies   Objective:  BP 102/70 (BP Location: Left Arm, Patient Position: Sitting, Cuff Size: Large)   Pulse 97   Temp 99 F (37.2 C) (Oral)   Resp 18   Ht 5' 7.13"  (1.705 m)   Wt 235 lb 9.6 oz (106.9 kg)   SpO2 100%   BMI 36.76 kg/m   Physical Exam  Constitutional: She is oriented to person, place, and time and well-developed, well-nourished, and in no distress.  HENT:  Head: Normocephalic and atraumatic.  Nose: Nose normal.  Mouth/Throat: Uvula is midline, oropharynx is clear and moist and mucous membranes are normal. No posterior oropharyngeal edema.  Few hyperpigmented papules noted on upper lip.  Eyes: Conjunctivae are normal.  Neck: Normal range of motion.  Pulmonary/Chest: Effort normal.  Large pendulous breasts bilaterally.  Neurological: She is alert and oriented to person, place, and time. Gait normal.  Skin: Skin is warm and dry. Rash ( Small area of hyperpigmentation in intermammary cleft.  No erythema or scaling noted.) noted.  Psychiatric: Affect normal.  Vitals reviewed.    Assessment and Plan :  1. Dermatitis 2. Candidiasis, intertrigo Improved significantly.  Educated on preventive measures in the future.  3. Rash and nonspecific skin eruption Refills of triamcinolone cream provided.  I had a discussion with patient that the affected areas she is applying cream likely need low potency steroid cream versus medium potency.   Educated her on the potential side effects of using higher potency steroid in certain areas on the body including, but not limited to, skin atrophy and hypopigmentation.  Encouraged her to try a low potency and if no improvement  with this can use medium potency cream.  Encouraged to use it sparingly and for short duration.  Encouraged her to still use nystatin cream and Desitin cream if intertrigo returns. Use steroid cream for pruritis in this area. Follow up as needed.  - triamcinolone cream (KENALOG) 0.1 %; Apply 1 application topically 2 (two) times daily.  Dispense: 30 g; Refill: 0 - triamcinolone (KENALOG) 0.025 % ointment; Apply 1 application topically 2 (two) times daily.  Dispense: 30 g; Refill:  0  Tenna Delaine PA-C  [facility] 06/12/2017 5:49 PM

## 2017-06-12 NOTE — Patient Instructions (Addendum)
I have given you a prescription for 2 steroid creams.  They are not name for the exact same thing but one is 0.025% and one is 0.1%.  The 0.025% is lower potency and the 0.1% is higher potency.  I would like you to try to use the lower potency one to see if this helps control your symptoms.  If no improvement with this, you can use a higher potency cream.  Just keep in mind that it can cause skin atrophy and hypopigmentation of the skin.  Therefore, do not use it for long durations at a time.  If any of your symptoms worsen or you develop new concerning symptoms, please return for reevaluation.   Triamcinolone skin cream, ointment, lotion, or aerosol What is this medicine? TRIAMCINOLONE (trye am SIN oh lone) is a corticosteroid. It is used on the skin to reduce swelling, redness, itching, and allergic reactions. This medicine may be used for other purposes; ask your health care provider or pharmacist if you have questions. COMMON BRAND NAME(S): Aristocort, Aristocort A, Aristocort HP, Cinalog, Cinolar, DERMASORB TA Complete, Flutex, Kenalog, Pediaderm TA, SP Rx 228, Triacet, Trianex, Triderm What should I tell my health care provider before I take this medicine? They need to know if you have any of these conditions: -diabetes -infection, like tuberculosis, herpes, or fungal infection -large areas of burned or damaged skin -skin wasting or thinning -an unusual or allergic reaction to triamcinolone, corticosteroids, other medicines, foods, dyes, or preservatives -pregnant or trying to get pregnant -breast-feeding How should I use this medicine? This medicine is for external use only. Do not take by mouth. Follow the directions on the prescription label. Wash your hands before and after use. Apply a thin film of medicine to the affected area. Do not cover with a bandage or dressing unless your doctor or health care professional tells you to. Do not use on healthy skin or over large areas of skin.  Do not get this medicine in your eyes. If you do, rinse out with plenty of cool tap water. It is important not to use more medicine than prescribed. Do not use your medicine more often than directed. Talk to your pediatrician regarding the use of this medicine in children. Special care may be needed. Elderly patients are more likely to have damaged skin through aging, and this may increase side effects. This medicine should only be used for brief periods and infrequently in older patients. Overdosage: If you think you have taken too much of this medicine contact a poison control center or emergency room at once. NOTE: This medicine is only for you. Do not share this medicine with others. What if I miss a dose? If you miss a dose, use it as soon as you can. If it is almost time for your next dose, use only that dose. Do not use double or extra doses. What may interact with this medicine? Interactions are not expected. This list may not describe all possible interactions. Give your health care provider a list of all the medicines, herbs, non-prescription drugs, or dietary supplements you use. Also tell them if you smoke, drink alcohol, or use illegal drugs. Some items may interact with your medicine. What should I watch for while using this medicine? Tell your doctor or health care professional if your symptoms do not start to get better within one week. Do not use for more than 14 days. Do not use on healthy skin or over large areas of skin. Tell  your doctor or health care professional if you are exposed to anyone with measles or chickenpox, or if you develop sores or blisters that do not heal properly. Do not use an airtight bandage to cover the affected area unless your doctor or health care professional tells you to. If you are to cover the area, follow the instructions carefully. Covering the area where the medicine is applied can increase the amount that passes through the skin and increases the risk  of side effects. If treating the diaper area of a child, avoid covering the treated area with tight-fitting diapers or plastic pants. This may increase the amount of medicine that passes through the skin and increase the risk of serious side effects. What side effects may I notice from receiving this medicine? Side effects that you should report to your doctor or health care professional as soon as possible: -burning or itching of the skin -dark red spots on the skin -infection -painful, red, pus filled blisters in hair follicles -thinning of the skin, sunburn more likely especially on the face Side effects that usually do not require medical attention (report to your doctor or health care professional if they continue or are bothersome): -dry skin, irritation -unusual increased growth of hair on the face or body This list may not describe all possible side effects. Call your doctor for medical advice about side effects. You may report side effects to FDA at 1-800-FDA-1088. Where should I keep my medicine? Keep out of the reach of children. Store at room temperature between 15 and 30 degrees C (59 and 86 degrees F). Do not freeze. Throw away any unused medicine after the expiration date. NOTE: This sheet is a summary. It may not cover all possible information. If you have questions about this medicine, talk to your doctor, pharmacist, or health care provider.  2018 Elsevier/Gold Standard (2013-06-27 15:59:51)     IF you received an x-ray today, you will receive an invoice from [facility]. Please contact [facility] at [phone] with questions or concerns regarding your invoice.   IF you received labwork today, you will receive an invoice from [place]. Please contact [facility] at [phone] with questions or concerns regarding your invoice.   Our billing staff will not be able to assist you with questions regarding bills from these companies.  You will be contacted  with the lab results as soon as they are available. The fastest way to get your results is to activate your My Chart account. Instructions are located on the last page of this paperwork. If you have not heard from [place] regarding the results in 2 weeks, please contact this office.

## 2017-12-12 ENCOUNTER — Other Ambulatory Visit: Payer: Self-pay | Admitting: Obstetrics and Gynecology

## 2017-12-12 DIAGNOSIS — Z1231 Encounter for screening mammogram for malignant neoplasm of breast: Secondary | ICD-10-CM

## 2018-01-12 ENCOUNTER — Ambulatory Visit
Admission: RE | Admit: 2018-01-12 | Discharge: 2018-01-12 | Disposition: A | Discharge status: Home / Self Care | Payer: BC Managed Care – PPO | Source: Ambulatory Visit | Attending: Obstetrics and Gynecology | Admitting: Obstetrics and Gynecology

## 2018-01-12 DIAGNOSIS — Z1231 Encounter for screening mammogram for malignant neoplasm of breast: Secondary | ICD-10-CM

## 2018-10-01 ENCOUNTER — Ambulatory Visit (INDEPENDENT_AMBULATORY_CARE_PROVIDER_SITE_OTHER): Payer: BC Managed Care – PPO | Admitting: Family Medicine

## 2018-10-01 ENCOUNTER — Other Ambulatory Visit: Payer: Self-pay

## 2018-10-01 ENCOUNTER — Encounter: Payer: Self-pay | Admitting: Family Medicine

## 2018-10-01 VITALS — BP 127/77 | HR 102 | Temp 98.4°F | Resp 14 | Wt 244.0 lb

## 2018-10-01 DIAGNOSIS — B372 Candidiasis of skin and nail: Secondary | ICD-10-CM

## 2018-10-01 MED ORDER — CLOTRIMAZOLE 1 % EX CREA: 1.0000 "application " | TOPICAL_CREAM | Freq: Two times a day (BID) | CUTANEOUS | 0 refills | Status: DC

## 2018-10-01 MED ORDER — FLUCONAZOLE 150 MG PO TABS: 150.0000 mg | ORAL_TABLET | Freq: Once | ORAL | 0 refills | Status: AC

## 2018-10-01 MED ORDER — NYSTATIN 100000 UNIT/GM EX POWD: Freq: Three times a day (TID) | CUTANEOUS | 1 refills | Status: DC

## 2018-10-01 NOTE — Patient Instructions (Addendum)
Rash does appear to be intertrigo/fungal infection again.  Right side appears more involved than left, but not severe at this time.  I think it is reasonable to try Diflucan once with the nystatin powder few times per day.   If needed I did write for antifungal cream which may be more effective if nystatin powder is not helping.  Thank you for coming in to see [place] today and please let me know if there are questions.  Return to the clinic or go to the nearest emergency room if any of your symptoms worsen or new symptoms occur.   Intertrigo Intertrigo is skin irritation (inflammation) that happens in warm, moist areas of the body. The irritation can cause a rash and make skin raw and itchy. The rash is usually pink or red. It happens mostly between folds of skin or where skin rubs together, such as:  Between the toes.  In the armpits.  In the groin area.  Under the belly.  Under the breasts.  Around the butt area. This condition is not passed from person to person (is not contagious). What are the causes?  Heat, moisture, rubbing, and not enough air movement.  The condition can be made worse by: ? Sweat. ? Bacteria. ? A fungus, such as yeast. What increases the risk?  Moisture in your skin folds.  You are more likely to develop this condition if you: ? Have diabetes. ? Are overweight. ? Are not able to move around. ? Live in a warm and moist climate. ? Wear splints, braces, or other medical devices. ? Are not able to control your pee (urine) or poop (stool). What are the signs or symptoms?  A pink or red skin rash in the skin fold or near the skin fold.  Raw or scaly skin.  Itching.  A burning feeling.  Bleeding.  Leaking fluid.  A bad smell. How is this treated?  Cleaning and drying your skin.  Taking an antibiotic medicine or using an antibiotic skin cream for a bacterial infection.  Using an antifungal cream on your skin or taking pills for an infection  that was caused by a fungus, such as yeast.  Using a steroid ointment to stop the itching and irritation.  Separating the skin fold with a clean cotton cloth to absorb moisture and allow air to flow into the area. Follow these instructions at home:  Keep the affected area clean and dry.  Do not scratch your skin.  Stay cool as much as you can. Use an air conditioner or a fan, if you have one.  Apply over-the-counter and prescription medicines only as told by your doctor.  If you were prescribed an antibiotic medicine, use it as told by your doctor. Do not stop using the antibiotic even if your condition starts to get better.  Keep all follow-up visits as told by your doctor. This is important. How is this prevented?   Stay at a healthy weight.  Take care of your feet. This is very important if you have diabetes. You should: ? Wear shoes that fit well. ? Keep your feet dry. ? Wear clean cotton or wool socks.  Protect the skin in your groin and butt area as told by your doctor. To do this: ? Follow a regular cleaning routine. ? Use creams, powders, or ointments that protect your skin. ? Change protection pads often.  Do not wear tight clothes. Wear clothes that: ? Are loose. ? Take moisture away from your  body. ? Are made of cotton.  Wear a bra that gives good support, if needed.  Shower and dry yourself well after being active. Use a hair dryer on a cool setting to dry between skin folds.  Keep your blood sugar under control if you have diabetes. Contact a doctor if:  Your symptoms do not get better with treatment.  Your symptoms get worse or they spread.  You notice more redness and warmth.  You have a fever. Summary  Intertrigo is skin irritation that occurs when folds of skin rub together.  This condition is caused by heat, moisture, and rubbing.  This condition may be treated by cleaning and drying your skin and with medicines.  Apply over-the-counter  and prescription medicines only as told by your doctor.  Keep all follow-up visits as told by your doctor. This is important. This information is not intended to replace advice given to you by your health care provider. Make sure you discuss any questions you have with your health care provider. Document Released: 04/09/2010 Document Revised: 12/14/2017 Document Reviewed: 12/14/2017 Elsevier Patient Education  [employer].   If you have lab work done today you will be contacted with your lab results within the next 2 weeks.  If you have not heard from [place] then please contact us. The fastest way to get your results is to register for My Chart.   IF you received an x-ray today, you will receive an invoice from [facility]. Please contact [facility] at [phone] with questions or concerns regarding your invoice.   IF you received labwork today, you will receive an invoice from [place]. Please contact [facility] at [phone] with questions or concerns regarding your invoice.   Our billing staff will not be able to assist you with questions regarding bills from these companies.  You will be contacted with the lab results as soon as they are available. The fastest way to get your results is to activate your My Chart account. Instructions are located on the last page of this paperwork. If you have not heard from [place] regarding the results in 2 weeks, please contact this office.

## 2018-10-01 NOTE — Progress Notes (Signed)
Subjective:    Patient ID: Jessica Mccarthy, female    DOB: December 11, 1970, 48 y.o.   MRN: [id number]  HPI Jessica Mccarthy is a 48 y.o. female Presents today for: Chief Complaint  Patient presents with  . Rash    Rash under both breast (Yeast infection) Patient has a hx of yeast infection.    Rash under breasts.  Itching.  Present for 1 week. Similar to prior yeast yeast infection under breast - has tried zeasorb otc as recommended by OBGYN. More sweating in area last week at work with A/C off Had some leftover nystatin powder - used few times per day past 2 days.  Has needed diflucan in past.   Patient Active Problem List   Diagnosis Date Noted  . 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 S/P partial hysterectomy    Past Surgical History:  Procedure Laterality Date  . ABDOMINAL HYSTERECTOMY     due to fibroids   No Known Allergies Prior to Admission medications   Not on File   Social History   Socioeconomic History  . Marital status: Legally Separated    Spouse name: Not on file  . Number of children: 1  . Years of education: College  . Highest education level: Not on file  Occupational History  . 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 Financial resource strain: Not on file  . Food insecurity    Worry: Not on file    Inability: Not on file  . Transportation needs    Medical: Not on file    Non-medical: Not on file  Tobacco Use  . Smoking status: Never Smoker  . Smokeless tobacco: Never Used  Substance and Sexual Activity  . Alcohol use: Yes    Alcohol/week: 0.0 - 1.0 standard drinks  . Drug use: No  . Sexual activity: Yes    Partners: Male    Birth control/protection: Surgical  Lifestyle  . Physical activity    Days per week: Not on file    Minutes per session: Not on file  . Stress: Not on file  Relationships  . Social Herbalist on phone: Not on  file    Gets together: Not on file    Attends religious service: Not on file    Active member of club or organization: Not on file    Attends meetings of clubs or organizations: Not on file    Relationship status: Not on file  . Intimate partner violence    Fear of current or ex partner: Not on file    Emotionally abused: Not on file    Physically abused: Not on file    Forced sexual activity: Not on file  Other Topics Concern  . Not on file  Social History Narrative   Daughter lives with her.   Married 1999. Legally separated 2017.    Review of Systems Per HPI.     Objective:   Physical Exam Constitutional:      General: She is not in acute distress.    Appearance: She is well-developed.  HENT:     Head: Normocephalic and atraumatic.  Cardiovascular:     Rate and Rhythm: Normal rate.  Pulmonary:     Effort: Pulmonary effort is normal.  Skin:    General: Skin is warm.       Neurological:     Mental Status:  She is alert and oriented to person, place, and time.    Vitals:   10/01/18 1700  BP: 127/77  Pulse: (!) 102  Resp: 14  Temp: 98.4 F (36.9 C)  TempSrc: Oral  SpO2: 98%  Weight: 244 lb (110.7 kg)      Assessment & Plan:    Jessica Mccarthy is a 48 y.o. female Candidiasis, intertrigo - Plan: nystatin (NYSTATIN) powder, clotrimazole (LOTRIMIN) 1 % cream, fluconazole (DIFLUCAN) 150 MG tablet Appears consistent with intertrigo, previous candidal intertrigo.  Likely related to increased sweating 1 week ago.    -reports some improvement with use of nystatin powder. Continue same with Diflucan 150 mg x 1.  Option of clotrimazole topical twice daily if needed with RTC precautions given. Meds ordered this encounter  Medications  . nystatin (NYSTATIN) powder    Sig: Apply topically 3 (three) times daily.    Dispense:  30 g    Refill:  1  . clotrimazole (LOTRIMIN) 1 % cream    Sig: Apply 1 application topically 2 (two) times daily.    Dispense:  30 g     Refill:  0  . fluconazole (DIFLUCAN) 150 MG tablet    Sig: Take 1 tablet (150 mg total) by mouth once for 1 dose.    Dispense:  1 tablet    Refill:  0   Patient Instructions    Rash does appear to be intertrigo/fungal infection again.  Right side appears more involved than left, but not severe at this time.  I think it is reasonable to try Diflucan once with the nystatin powder few times per day.   If needed I did write for antifungal cream which may be more effective if nystatin powder is not helping.  Thank you for coming in to see [place] today and please let me know if there are questions.  Return to the clinic or go to the nearest emergency room if any of your symptoms worsen or new symptoms occur.   Intertrigo Intertrigo is skin irritation (inflammation) that happens in warm, moist areas of the body. The irritation can cause a rash and make skin raw and itchy. The rash is usually pink or red. It happens mostly between folds of skin or where skin rubs together, such as:  Between the toes.  In the armpits.  In the groin area.  Under the belly.  Under the breasts.  Around the butt area. This condition is not passed from person to person (is not contagious). What are the causes?  Heat, moisture, rubbing, and not enough air movement.  The condition can be made worse by: ? Sweat. ? Bacteria. ? A fungus, such as yeast. What increases the risk?  Moisture in your skin folds.  You are more likely to develop this condition if you: ? Have diabetes. ? Are overweight. ? Are not able to move around. ? Live in a warm and moist climate. ? Wear splints, braces, or other medical devices. ? Are not able to control your pee (urine) or poop (stool). What are the signs or symptoms?  A pink or red skin rash in the skin fold or near the skin fold.  Raw or scaly skin.  Itching.  A burning feeling.  Bleeding.  Leaking fluid.  A bad smell. How is this treated?  Cleaning and  drying your skin.  Taking an antibiotic medicine or using an antibiotic skin cream for a bacterial infection.  Using an antifungal cream on your skin or taking  pills for an infection that was caused by a fungus, such as yeast.  Using a steroid ointment to stop the itching and irritation.  Separating the skin fold with a clean cotton cloth to absorb moisture and allow air to flow into the area. Follow these instructions at home:  Keep the affected area clean and dry.  Do not scratch your skin.  Stay cool as much as you can. Use an air conditioner or a fan, if you have one.  Apply over-the-counter and prescription medicines only as told by your doctor.  If you were prescribed an antibiotic medicine, use it as told by your doctor. Do not stop using the antibiotic even if your condition starts to get better.  Keep all follow-up visits as told by your doctor. This is important. How is this prevented?   Stay at a healthy weight.  Take care of your feet. This is very important if you have diabetes. You should: ? Wear shoes that fit well. ? Keep your feet dry. ? Wear clean cotton or wool socks.  Protect the skin in your groin and butt area as told by your doctor. To do this: ? Follow a regular cleaning routine. ? Use creams, powders, or ointments that protect your skin. ? Change protection pads often.  Do not wear tight clothes. Wear clothes that: ? Are loose. ? Take moisture away from your body. ? Are made of cotton.  Wear a bra that gives good support, if needed.  Shower and dry yourself well after being active. Use a hair dryer on a cool setting to dry between skin folds.  Keep your blood sugar under control if you have diabetes. Contact a doctor if:  Your symptoms do not get better with treatment.  Your symptoms get worse or they spread.  You notice more redness and warmth.  You have a fever. Summary  Intertrigo is skin irritation that occurs when folds of skin  rub together.  This condition is caused by heat, moisture, and rubbing.  This condition may be treated by cleaning and drying your skin and with medicines.  Apply over-the-counter and prescription medicines only as told by your doctor.  Keep all follow-up visits as told by your doctor. This is important. This information is not intended to replace advice given to you by your health care provider. Make sure you discuss any questions you have with your health care provider. Document Released: 04/09/2010 Document Revised: 12/14/2017 Document Reviewed: 12/14/2017 Elsevier Patient Education  [employer].   If you have lab work done today you will be contacted with your lab results within the next 2 weeks.  If you have not heard from [place] then please contact us. The fastest way to get your results is to register for My Chart.   IF you received an x-ray today, you will receive an invoice from [facility]. Please contact [facility] at [phone] with questions or concerns regarding your invoice.   IF you received labwork today, you will receive an invoice from [facility]. Please contact [facility] at [phone] with questions or concerns regarding your invoice.   Our billing staff will not be able to assist you with questions regarding bills from these companies.  You will be contacted with the lab results as soon as they are available. The fastest way to get your results is to activate your My Chart account. Instructions are located on the last page of this paperwork. If you have not heard from [place] regarding the  results in 2 weeks, please contact this office.       Signed,   Merri Ray, MD [facility].  10/01/18 5:55 PM

## 2018-12-20 ENCOUNTER — Other Ambulatory Visit: Payer: Self-pay | Admitting: Obstetrics and Gynecology

## 2018-12-20 DIAGNOSIS — Z1231 Encounter for screening mammogram for malignant neoplasm of breast: Secondary | ICD-10-CM

## 2019-02-01 ENCOUNTER — Ambulatory Visit
Admission: RE | Admit: 2019-02-01 | Discharge: 2019-02-01 | Disposition: A | Discharge status: Home / Self Care | Payer: BC Managed Care – PPO | Source: Ambulatory Visit | Attending: Obstetrics and Gynecology | Admitting: Obstetrics and Gynecology

## 2019-02-01 ENCOUNTER — Other Ambulatory Visit: Payer: Self-pay

## 2019-02-01 DIAGNOSIS — Z1231 Encounter for screening mammogram for malignant neoplasm of breast: Secondary | ICD-10-CM

## 2019-02-11 ENCOUNTER — Other Ambulatory Visit: Payer: Self-pay

## 2019-02-11 ENCOUNTER — Ambulatory Visit (INDEPENDENT_AMBULATORY_CARE_PROVIDER_SITE_OTHER): Payer: BC Managed Care – PPO | Admitting: Family Medicine

## 2019-02-11 ENCOUNTER — Encounter: Payer: Self-pay | Admitting: Family Medicine

## 2019-02-11 VITALS — BP 117/78 | HR 91 | Temp 99.0°F | Wt 236.0 lb

## 2019-02-11 DIAGNOSIS — S40211A Abrasion of right shoulder, initial encounter: Secondary | ICD-10-CM

## 2019-02-11 DIAGNOSIS — G44209 Tension-type headache, unspecified, not intractable: Secondary | ICD-10-CM

## 2019-02-11 DIAGNOSIS — M25511 Pain in right shoulder: Secondary | ICD-10-CM | POA: Diagnosis not present

## 2019-02-11 DIAGNOSIS — K6289 Other specified diseases of anus and rectum: Secondary | ICD-10-CM

## 2019-02-11 DIAGNOSIS — D225 Melanocytic nevi of trunk: Secondary | ICD-10-CM

## 2019-02-11 MED ORDER — LIDOCAINE (ANORECTAL) 5 % EX GEL: CUTANEOUS | 0 refills | Status: DC

## 2019-02-11 NOTE — Progress Notes (Signed)
Subjective:  Patient ID: Jessica Mccarthy, female    DOB: Sep 04, 1970  Age: 48 y.o. MRN: [id number]  CC:  Chief Complaint  Patient presents with  . Annual Exam    annual exam with no other issues at this time. Will make another appt for the other issues. Patient is not fasting today    HPI Jessica Mccarthy presents for   Annual exam, but here with other specific concerns.  Will reschedule physical  Rectal pain/hemorrhoids? Soreness at anal area past few months.  Avoids having BM at work. Delays BM  - 3 per week. Straining at times to start, but not hard stools. Rare BRPBR on tissue or commode after straining. Sore in area after blood. ? Possible bumps/protrusion in area.  OBGYN eval in 2 days - plans on exam at that time.   Shoulder pain Past 6 months. Top of shoulder with twisting bar around. Sore with strap. Tried padded/wide strap. Some discoloration on shoulder. Able to move shoulder/arm fine.   Breast discoloration: Treated for possible intertrigo back in March with steroid cream, Then office visit with me July 13 with 1 week of symptoms at that time.  Had been improving somewhat with leftover nystatin powder.  Appear to be consistent with intertrigo, treated with Diflucan 150 mg x 1, option of clotrimazole topical twice daily if needed in place of nystatin powder. That area is not an issue - still using nystatin powder and zeasorb.   L upper breast mole: Possible increased size past 6 months to a year. Feels like bigger than other moles. No dermatology.   Headaches: Past 3-4 months.  Front and wrap to sides.  Some increased stress past few months.  No history of tension HA's.  No vision change, no weakness.  Not worse HA of life.  No history of migraines.  Tx: none.   No specific stress relief techniques - some increased stress with pandemic.   History Patient Active Problem List   Diagnosis Date Noted  . 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 S/P partial hysterectomy    Past Surgical History:  Procedure Laterality Date  . ABDOMINAL HYSTERECTOMY     due to fibroids   No Known Allergies Prior to Admission medications   Medication Sig Start Date End Date Taking? Authorizing Provider  clotrimazole (LOTRIMIN) 1 % cream Apply 1 application topically 2 (two) times daily. 10/01/18  Yes Wendie Agreste, MD  nystatin (NYSTATIN) powder Apply topically 3 (three) times daily. 10/01/18  Yes Wendie Agreste, MD   Social History   Socioeconomic History  . Marital status: Legally Separated    Spouse name: Not on file  . Number of children: 1  . Years of education: College  . Highest education level: Not on file  Occupational History  . 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 Financial resource strain: Not on file  . Food insecurity    Worry: Not on file    Inability: Not on file  . Transportation needs    Medical: Not on file    Non-medical: Not on file  Tobacco Use  . Smoking status: Never Smoker  . Smokeless tobacco: Never Used  Substance and Sexual Activity  . Alcohol use: Yes    Alcohol/week: 0.0 - 1.0 standard drinks  . Drug use: No  . Sexual activity: Yes    Partners: Male  Birth control/protection: Surgical  Lifestyle  . Physical activity    Days per week: Not on file    Minutes per session: Not on file  . Stress: Not on file  Relationships  . Social Herbalist on phone: Not on file    Gets together: Not on file    Attends religious service: Not on file    Active member of club or organization: Not on file    Attends meetings of clubs or organizations: Not on file    Relationship status: Not on file  . Intimate partner violence    Fear of current or ex partner: Not on file    Emotionally abused: Not on file    Physically abused: Not on file    Forced sexual activity: Not on file  Other Topics Concern  . Not on  file  Social History Narrative   Daughter lives with her.   Married 1999. Legally separated 2017.    Review of Systems Per HPI.   Objective:   Vitals:   02/11/19 1547  BP: 117/78  Pulse: 91  Temp: 99 F (37.2 C)  TempSrc: Oral  SpO2: 100%  Weight: 236 lb (107 kg)     Physical Exam Constitutional:      General: She is not in acute distress.    Appearance: She is well-developed.  HENT:     Head: Normocephalic and atraumatic.  Cardiovascular:     Rate and Rhythm: Normal rate.  Pulmonary:     Effort: Pulmonary effort is normal.  Skin:      Neurological:     Mental Status: She is alert and oriented to person, place, and time.        Assessment & Plan:  Jessica Mccarthy is a 48 y.o. female . Anal pain - Plan: Lidocaine, Anorectal, 5 % GEL  -Suspected hemorrhoids versus anal fissure.  Constipation prevention discussed but also bowel regimen reviewed to minimize straining.  Deferred exam as she plans on having that performed in few days at her OB/GYN visit.  Lidocaine topical gel given if needed for possible fissure.  Tension headache  -Based on location suspected tension headache.  Handout given, stress management reviewed but appears to be doing well currently.  RTC precautions if persistent, rare Tylenol if needed, avoid frequent/overuse of NSAIDs.  Pain in joint of right shoulder Abrasion of right shoulder, initial encounter  -Appears to be abrasion from bra strap.  Wider strap discussed but already trying to do so.  Padding below bra strap also discussed, symptomatic care with hydrating lotion and can also discuss with dermatology.  Melanocytic nevus of trunk - Plan: Ambulatory referral to Dermatology  -Refer to dermatology to evaluate slightly atypical nevus of left chest wall.  No orders of the defined types were placed in this encounter.  Patient Instructions       If you have lab work done today you will be contacted with your lab results within the  next 2 weeks.  If you have not heard from [place] then please contact us. The fastest way to get your results is to register for My Chart.   IF you received an x-ray today, you will receive an invoice from [facility]. Please contact [facility] at [phone] with questions or concerns regarding your invoice.   IF you received labwork today, you will receive an invoice from [facility]. Please contact [facility] at [phone] with questions or concerns regarding your invoice.   Our billing staff will  not be able to assist you with questions regarding bills from these companies.  You will be contacted with the lab results as soon as they are available. The fastest way to get your results is to activate your My Chart account. Instructions are located on the last page of this paperwork. If you have not heard from [place] regarding the results in 2 weeks, please contact this office.          Signed, Merri Ray, MD [facility]

## 2019-02-11 NOTE — Patient Instructions (Addendum)
Possible hemorrhoids or anal fissure.  Ask OBGyn to do exam on area in 2 days.  For now - try to sit on toilet 15 - 20 mins after dinner to have BM. Lidocaine rectal gel if needed.  Stool softener if needed for any hard stools.  Follow up in next month if not improving  - sooner if worse.   Try wider bra strap to spread pressure out, but also discuss with obgyn.  aveeno or eucerin to dry/irritated skin on shoulder.   See info on tension headaches. We can recheck these at physical in few weeks. Return to the clinic or go to the nearest emergency room if any of your symptoms worsen or new symptoms occur.  I will refer you to dermatology to evaluate the mole on the left chest.   Thanks for coming in today and take care.    Tension Headache, Adult A tension headache is a feeling of pain, pressure, or aching in the head that is often felt over the front and sides of the head. The pain can be dull, or it can feel tight (constricting). There are two types of tension headache:  Episodic tension headache. This is when the headaches happen fewer than 15 days a month.  Chronic tension headache. This is when the headaches happen more than 15 days a month during a 37-month period. A tension headache can last from 30 minutes to several days. It is the most common kind of headache. Tension headaches are not normally associated with nausea or vomiting, and they do not get worse with physical activity. What are the causes? The exact cause of this condition is not known. Tension headaches are often triggered by stress, anxiety, or depression. Other triggers include:  Alcohol.  Too much caffeine or caffeine withdrawal.  Respiratory infections, such as colds, flu, or sinus infections.  Dental problems or teeth clenching.  Tiredness (fatigue).  Holding your head and neck in the same position for a long period of time, such as while using a computer.  Smoking.  Arthritis of the neck. What are  the signs or symptoms? Symptoms of this condition include:  A feeling of pressure or tightness around the head.  Dull, aching head pain.  Pain over the front and sides of the head.  Tenderness in the muscles of the head, neck, and shoulders. How is this diagnosed? This condition may be diagnosed based on your symptoms, your medical history, and a physical exam. If your symptoms are severe or unusual, you may have imaging tests, such as a CT scan or an MRI of your head. Your vision may also be checked. How is this treated? This condition may be treated with lifestyle changes and with medicines that help relieve symptoms. Follow these instructions at home: Managing pain  Take over-the-counter and prescription medicines only as told by your health care provider.  When you have a headache, lie down in a dark, quiet room.  If directed, apply ice to the head and neck: ? Put ice in a plastic bag. ? Place a towel between your skin and the bag. ? Leave the ice on for 20 minutes, 2-3 times a day.  If directed, apply heat to the back of your neck as often as told by your health care provider. Use the heat source that your health care provider recommends, such as a moist heat pack or a heating pad. ? Place a towel between your skin and the heat source. ? Leave the heat  on for 20-30 minutes. ? Remove the heat if your skin turns bright red. This is especially important if you are unable to feel pain, heat, or cold. You may have a greater risk of getting burned. Eating and drinking  Eat meals on a regular schedule.  Limit alcohol intake to no more than 1 drink a day for nonpregnant women and 2 drinks a day for men. One drink equals 12 oz of beer, 5 oz of wine, or 1 oz of hard liquor.  Drink enough fluid to keep your urine pale yellow.  Decrease your caffeine intake, or stop using caffeine. Lifestyle  Get 7-9 hours of sleep each night, or get the amount of sleep recommended by your health  care provider.  At bedtime, remove all electronic devices from your room. Electronic devices include computers, phones, and tablets.  Find ways to manage your stress. Some things that can help relieve stress include: ? Exercise. ? Deep breathing exercises. ? Yoga. ? Listening to music. ? Positive mental imagery.  Try to sit up straight and avoid tensing your muscles.  Do not use any products that contain nicotine or tobacco, such as cigarettes and e-cigarettes. If you need help quitting, ask your health care provider. General instructions   Keep all follow-up visits as told by your health care provider. This is important.  Avoid any headache triggers. Keep a headache journal to help find out what may trigger your headaches. For example, write down: ? What you eat and drink. ? How much sleep you get. ? Any change to your diet or medicines. Contact a health care provider if:  Your headache does not get better.  Your headache comes back.  You are sensitive to sounds, light, or smells because of a headache.  You have nausea or you vomit.  Your stomach hurts. Get help right away if:  You suddenly develop a very severe headache along with any of the following: ? A stiff neck. ? Nausea and vomiting. ? Confusion. ? Weakness. ? Double vision or loss of vision. ? Shortness of breath. ? Rash. ? Unusual sleepiness. ? Fever. ? Trouble speaking. ? Pain in your eyes or ears. ? Trouble walking or balancing. ? Feeling faint or passing out. Summary  A tension headache is a feeling of pain, pressure, or aching in the head that is often felt over the front and sides of the head.  A tension headache can last from 30 minutes to several days. It is the most common kind of headache.  This condition may be diagnosed based on your symptoms, your medical history, and a physical exam.  This condition may be treated with lifestyle changes and with medicines that help relieve  symptoms. This information is not intended to replace advice given to you by your health care provider. Make sure you discuss any questions you have with your health care provider. Document Released: 03/07/2005 Document Revised: 02/17/2017 Document Reviewed: 06/17/2016 Elsevier Patient Education  2020 [employer].   Hemorrhoids Hemorrhoids are swollen veins in and around the rectum or anus. There are two types of hemorrhoids:  Internal hemorrhoids. These occur in the veins that are just inside the rectum. They may poke through to the outside and become irritated and painful.  External hemorrhoids. These occur in the veins that are outside the anus and can be felt as a painful swelling or hard lump near the anus. Most hemorrhoids do not cause serious problems, and they can be managed with home treatments such  as diet and lifestyle changes. If home treatments do not help the symptoms, procedures can be done to shrink or remove the hemorrhoids. What are the causes? This condition is caused by increased pressure in the anal area. This pressure may result from various things, including:  Constipation.  Straining to have a bowel movement.  Diarrhea.  Pregnancy.  Obesity.  Sitting for long periods of time.  Heavy lifting or other activity that causes you to strain.  Anal sex.  Riding a bike for a long period of time. What are the signs or symptoms? Symptoms of this condition include:  Pain.  Anal itching or irritation.  Rectal bleeding.  Leakage of stool (feces).  Anal swelling.  One or more lumps around the anus. How is this diagnosed? This condition can often be diagnosed through a visual exam. Other exams or tests may also be done, such as:  An exam that involves feeling the rectal area with a gloved hand (digital rectal exam).  An exam of the anal canal that is done using a small tube (anoscope).  A blood test, if you have lost a significant amount of blood.  A  test to look inside the colon using a flexible tube with a camera on the end (sigmoidoscopy or colonoscopy). How is this treated? This condition can usually be treated at home. However, various procedures may be done if dietary changes, lifestyle changes, and other home treatments do not help your symptoms. These procedures can help make the hemorrhoids smaller or remove them completely. Some of these procedures involve surgery, and others do not. Common procedures include:  Rubber band ligation. Rubber bands are placed at the base of the hemorrhoids to cut off their blood supply.  Sclerotherapy. Medicine is injected into the hemorrhoids to shrink them.  Infrared coagulation. A type of light energy is used to get rid of the hemorrhoids.  Hemorrhoidectomy surgery. The hemorrhoids are surgically removed, and the veins that supply them are tied off.  Stapled hemorrhoidopexy surgery. The surgeon staples the base of the hemorrhoid to the rectal wall. Follow these instructions at home: Eating and drinking   Eat foods that have a lot of fiber in them, such as whole grains, beans, nuts, fruits, and vegetables.  Ask your health care provider about taking products that have added fiber (fiber supplements).  Reduce the amount of fat in your diet. You can do this by eating low-fat dairy products, eating less red meat, and avoiding processed foods.  Drink enough fluid to keep your urine pale yellow. Managing pain and swelling   Take warm sitz baths for 20 minutes, 3-4 times a day to ease pain and discomfort. You may do this in a bathtub or using a portable sitz bath that fits over the toilet.  If directed, apply ice to the affected area. Using ice packs between sitz baths may be helpful. ? Put ice in a plastic bag. ? Place a towel between your skin and the bag. ? Leave the ice on for 20 minutes, 2-3 times a day. General instructions  Take over-the-counter and prescription medicines only as told  by your health care provider.  Use medicated creams or suppositories as told.  Get regular exercise. Ask your health care provider how much and what kind of exercise is best for you. In general, you should do moderate exercise for at least 30 minutes on most days of the week (150 minutes each week). This can include activities such as walking, biking, or yoga.  Go to the bathroom when you have the urge to have a bowel movement. Do not wait.  Avoid straining to have bowel movements.  Keep the anal area dry and clean. Use wet toilet paper or moist towelettes after a bowel movement.  Do not sit on the toilet for long periods of time. This increases blood pooling and pain.  Keep all follow-up visits as told by your health care provider. This is important. Contact a health care provider if you have:  Increasing pain and swelling that are not controlled by treatment or medicine.  Difficulty having a bowel movement, or you are unable to have a bowel movement.  Pain or inflammation outside the area of the hemorrhoids. Get help right away if you have:  Uncontrolled bleeding from your rectum. Summary  Hemorrhoids are swollen veins in and around the rectum or anus.  Most hemorrhoids can be managed with home treatments such as diet and lifestyle changes.  Taking warm sitz baths can help ease pain and discomfort.  In severe cases, procedures or surgery can be done to shrink or remove the hemorrhoids. This information is not intended to replace advice given to you by your health care provider. Make sure you discuss any questions you have with your health care provider. Document Released: 03/04/2000 Document Revised: 03/15/2018 Document Reviewed: 07/27/2017 Elsevier Patient Education  [employer].    If you have lab work done today you will be contacted with your lab results within the next 2 weeks.  If you have not heard from [place] then please contact us. The fastest way to get your  results is to register for My Chart.   IF you received an x-ray today, you will receive an invoice from [facility]. Please contact [facility] at [phone] with questions or concerns regarding your invoice.   IF you received labwork today, you will receive an invoice from [facility]. Please contact [facility] at [phone] with questions or concerns regarding your invoice.   Our billing staff will not be able to assist you with questions regarding bills from these companies.  You will be contacted with the lab results as soon as they are available. The fastest way to get your results is to activate your My Chart account. Instructions are located on the last page of this paperwork. If you have not heard from [place] regarding the results in 2 weeks, please contact this office.

## 2019-03-08 ENCOUNTER — Ambulatory Visit (INDEPENDENT_AMBULATORY_CARE_PROVIDER_SITE_OTHER): Payer: BC Managed Care – PPO | Admitting: Family Medicine

## 2019-03-08 ENCOUNTER — Other Ambulatory Visit (HOSPITAL_COMMUNITY)
Admission: RE | Admit: 2019-03-08 | Discharge: 2019-03-08 | Disposition: A | Discharge status: Home / Self Care | Payer: BC Managed Care – PPO | Source: Ambulatory Visit | Attending: Family Medicine | Admitting: Family Medicine

## 2019-03-08 ENCOUNTER — Encounter: Payer: Self-pay | Admitting: Family Medicine

## 2019-03-08 ENCOUNTER — Other Ambulatory Visit: Payer: Self-pay

## 2019-03-08 VITALS — BP 111/75 | HR 76 | Temp 97.4°F | Ht 67.0 in | Wt 237.4 lb

## 2019-03-08 DIAGNOSIS — R21 Rash and other nonspecific skin eruption: Secondary | ICD-10-CM | POA: Diagnosis not present

## 2019-03-08 DIAGNOSIS — Z13220 Encounter for screening for lipoid disorders: Secondary | ICD-10-CM

## 2019-03-08 DIAGNOSIS — Z131 Encounter for screening for diabetes mellitus: Secondary | ICD-10-CM

## 2019-03-08 DIAGNOSIS — Z0001 Encounter for general adult medical examination with abnormal findings: Secondary | ICD-10-CM

## 2019-03-08 DIAGNOSIS — Z113 Encounter for screening for infections with a predominantly sexual mode of transmission: Secondary | ICD-10-CM | POA: Insufficient documentation

## 2019-03-08 DIAGNOSIS — Z23 Encounter for immunization: Secondary | ICD-10-CM

## 2019-03-08 DIAGNOSIS — Z0000 Encounter for general adult medical examination without abnormal findings: Secondary | ICD-10-CM

## 2019-03-08 MED ORDER — CLOTRIMAZOLE-BETAMETHASONE 1-0.05 % EX CREA: 1.0000 "application " | TOPICAL_CREAM | Freq: Two times a day (BID) | CUTANEOUS | 0 refills | Status: DC

## 2019-03-08 NOTE — Progress Notes (Signed)
Subjective:  Patient ID: Jessica Mccarthy, female    DOB: 30-Nov-1970  Age: 48 y.o. MRN: [id number]  CC:  Chief Complaint  Patient presents with  . Annual Exam    Cpe    HPI Jessica Mccarthy presents for   Annual exam.  Most recently seen November 23.  At that time discussed probable tension headaches, hemorrhoids versus anal fissure, irritation from bra strap on shoulder as well as melanocytic nevus of trunk and was referred to dermatology.  Headaches less frequent - more mindful of diet and eating regularly.  Now thorough peak time at work - not eating well then.   Had hemorrhoid - used Rx and suppositories. No recent bleeding and pain has lessened.   Has not seen derm yet. Padding under bra, and changing bra strap.  Has not had recent bra fitting. Considered breast reduction in 2013 - approved then, but denied few years later. Will try to get covered again with frequent yeast infections under breast, pain with bra strap, posture issues, back pain at times and sore to exercise.   Rash on left wrist -  Noticed few weeks ago. Wears watch but higher. Itches at times.  Tx: none. No other areas involved.   Cancer screening No FH of colon CA.  Breast: mammogram 02/01/19.  Pap:02/13/19 at [place] - appt being scheduled.   STI screening Same partner 3 years, requests testing.   Immunization History  Administered Date(s) Administered  . Influenza-Unspecified 12/20/2015  . Tdap 03/08/2019  01/03/19 Tetanus - today.    Depression screen [facility] 2/9 03/08/2019 02/11/2019 10/01/2018 06/12/2017 04/06/2017  Decreased Interest 0 0 0 0 0  Down, Depressed, Hopeless 0 0 0 0 0  PHQ - 2 Score 0 0 0 0 0     Hearing Screening   125Hz  250Hz  500Hz  1000Hz  2000Hz  3000Hz  4000Hz  6000Hz  8000Hz   Right ear:           Left ear:             Visual Acuity Screening   Right eye Left eye Both eyes  Without correction:     With correction: 20/15 20/15 20/15   last optho visit 2 years ago  Plans to  schedule   Dental: every 6 months.  Exercise: minimal recently with pandemic. Walk and elliptical in past. Weight of breasts has been limiting in past to soreness with exercise. Has lost weight in past, then small frame with large breasts was worse.  Wt Readings from Last 3 Encounters:  03/08/19 237 lb 6.4 oz (107.7 kg)  02/11/19 236 lb (107 kg)  10/01/18 244 lb (110.7 kg)    History Patient Active Problem List   Diagnosis Date Noted  . 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 S/P partial hysterectomy    Past Surgical History:  Procedure Laterality Date  . ABDOMINAL HYSTERECTOMY     due to fibroids   No Known Allergies Prior to Admission medications   Medication Sig Start Date End Date Taking? Authorizing Provider  clotrimazole (LOTRIMIN) 1 % cream Apply 1 application topically 2 (two) times daily. 10/01/18  Yes Wendie Agreste, MD  Lidocaine, Anorectal, 5 % GEL Apply pea-sized amount to the perianal skin up to 3 times per day as needed before bowel movement. 02/11/19  Yes Wendie Agreste, MD  nystatin (NYSTATIN) powder Apply topically 3 (three) times daily. 10/01/18  Yes Wendie Agreste, MD   Social History   Socioeconomic History  .  Marital status: Legally Separated    Spouse name: Not on file  . Number of children: 1  . Years of education: College  . Highest education level: Not on file  Occupational History  . 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 Smoking status: Never Smoker  . Smokeless tobacco: Never Used  Substance and Sexual Activity  . Alcohol use: Yes    Alcohol/week: 0.0 - 1.0 standard drinks  . Drug use: No  . Sexual activity: Yes    Partners: Male    Birth control/protection: Surgical  Other Topics Concern  . Not on file  Social History Narrative   Daughter lives with her.   Married 1999. Legally separated 2017.   Social Determinants of Health    Financial Resource Strain:   . Difficulty of Paying Living Expenses: Not on file  Food Insecurity:   . Worried About Charity fundraiser in the Last Year: Not on file  . Ran Out of Food in the Last Year: Not on file  Transportation Needs:   . Lack of Transportation (Medical): Not on file  . Lack of Transportation (Non-Medical): Not on file  Physical Activity:   . Days of Exercise per Week: Not on file  . Minutes of Exercise per Session: Not on file  Stress:   . Feeling of Stress : Not on file  Social Connections:   . Frequency of Communication with Friends and Family: Not on file  . Frequency of Social Gatherings with Friends and Family: Not on file  . Attends Religious Services: Not on file  . Active Member of Clubs or Organizations: Not on file  . Attends Archivist Meetings: Not on file  . Marital Status: Not on file  Intimate Partner Violence:   . Fear of Current or Ex-Partner: Not on file  . Emotionally Abused: Not on file  . Physically Abused: Not on file  . Sexually Abused: Not on file    Review of Systems   Objective:   Vitals:   03/08/19 0812  BP: 111/75  Pulse: 76  Temp: (!) 97.4 F (36.3 C)  TempSrc: Temporal  SpO2: 100%  Weight: 237 lb 6.4 oz (107.7 kg)  Height: 5' 7"  (1.702 m)     Physical Exam Vitals reviewed.  Constitutional:      Appearance: She is well-developed.  HENT:     Head: Normocephalic and atraumatic.     Right Ear: External ear normal.     Left Ear: External ear normal.  Eyes:     Conjunctiva/sclera: Conjunctivae normal.     Pupils: Pupils are equal, round, and reactive to light.  Neck:     Thyroid: No thyromegaly.  Cardiovascular:     Rate and Rhythm: Normal rate and regular rhythm.     Heart sounds: Normal heart sounds. No murmur.  Pulmonary:     Effort: Pulmonary effort is normal. No respiratory distress.     Breath sounds: Normal breath sounds. No wheezing.  Abdominal:     General: Bowel sounds are normal.      Palpations: Abdomen is soft.     Tenderness: There is no abdominal tenderness.  Musculoskeletal:        General: No tenderness. Normal range of motion.     Cervical back: Normal range of motion and neck supple.  Lymphadenopathy:     Cervical: No cervical adenopathy.  Skin:    General: Skin is  warm and dry.     Findings: No rash.       Neurological:     Mental Status: She is alert and oriented to person, place, and time.  Psychiatric:        Behavior: Behavior normal.        Thought Content: Thought content normal.        Assessment & Plan:  Jessica Mccarthy is a 48 y.o. female . Annual physical exam  - -anticipatory guidance as below in AVS, screening labs above. Health maintenance items as above in HPI discussed/recommended as applicable.   -Plans to meet with surgeon in January for breast reduction surgery discussion.  Can follow-up to discuss details or statement from me if needed.  -Headaches improved, RTC precautions for headaches, hemorrhoids, other symptoms reviewed last visit.  Need for Tdap vaccination - Plan: TDAP VACCINE  Screening for diabetes mellitus - Plan: CMP14+EGFR  Screening for hyperlipidemia - Plan: Lipid panel  Rash - Plan: clotrimazole-betamethasone (LOTRISONE) cream  -Possible small area of tinea corporis.  Lotrisone cream twice per day with RTC precautions  Routine screening for STI (sexually transmitted infection) - Plan: HIV antibody, RPR, GC/Chlamydia probe amp ([place])not at [facility] ordered this encounter  Medications  . clotrimazole-betamethasone (LOTRISONE) cream    Sig: Apply 1 application topically 2 (two) times daily.    Dispense:  30 g    Refill:  0   Patient Instructions    Try lotrisone cream for rash on the arm twice per day until it has resolved.  If that is increasing in size or other areas are involved, let me know.  Thanks for coming in today and take care.  Keeping You Healthy  Get These Tests 1. Blood  Pressure- Have your blood pressure checked once a year by your health care provider.  Normal blood pressure is 120/80. 2. Weight- Have your body mass index (BMI) calculated to screen for obesity.  BMI is measure of body fat based on height and weight.  You can also calculate your own BMI at GravelBags.it. 3. Cholesterol- Have your cholesterol checked every 5 years starting at age 35 then yearly starting at age 2. 65. Chlamydia, HIV, and other sexually transmitted diseases- Get screened every year until age 40, then within three months of each new sexual provider. 5. Pap Test - Every 1-5 years; discuss with your health care provider. 6. Mammogram- Every 1-2 years starting at age 6--50  Take these medicines  Calcium with Vitamin D-Your body needs 1200 mg of Calcium each day and [phone] IU of Vitamin D daily.  Your body can only absorb 500 mg of Calcium at a time so Calcium must be taken in 2 or 3 divided doses throughout the day.  Multivitamin with folic acid- Once daily if it is possible for you to become pregnant.  Get these Immunizations  Gardasil-Series of three doses; prevents HPV related illness such as genital warts and cervical cancer.  Menactra-Single dose; prevents meningitis.  Tetanus shot- Every 10 years.  Flu shot-Every year.  Take these steps 1. Do not smoke-Your healthcare provider can help you quit.  For tips on how to quit go to www.smokefree.gov or call 1-800 QUITNOW. 2. Be physically active- Exercise 5 days a week for at least 30 minutes.  If you are not already physically active, start slow and gradually work up to 30 minutes of moderate physical activity.  Examples of moderate activity include walking briskly, dancing, swimming, bicycling, etc. 3.  Breast Cancer- A self breast exam every month is important for early detection of breast cancer.  For more information and instruction on self breast exams, ask your healthcare provider or  https://www.patel.info/. 4. Eat a healthy diet- Eat a variety of healthy foods such as fruits, vegetables, whole grains, low fat milk, low fat cheeses, yogurt, lean meats, poultry and fish, beans, nuts, tofu, etc.  For more information go to www. Thenutritionsource.org 5. Drink alcohol in moderation- Limit alcohol intake to one drink or less per day. Never drink and drive. 6. Depression- Your emotional health is as important as your physical health.  If you're feeling down or losing interest in things you normally enjoy please talk to your healthcare provider about being screened for depression. 7. Dental visit- Brush and floss your teeth twice daily; visit your dentist twice a year. 8. Eye doctor- Get an eye exam at least every 2 years. 9. Helmet use- Always wear a helmet when riding a bicycle, motorcycle, rollerblading or skateboarding. 99. Safe sex- If you may be exposed to sexually transmitted infections, use a condom. 11. Seat belts- Seat belts can save your live; always wear one. 12. Smoke/Carbon Monoxide detectors- These detectors need to be installed on the appropriate level of your home. Replace batteries at least once a year. 13. Skin cancer- When out in the sun please cover up and use sunscreen 15 SPF or higher. 14. Violence- If anyone is threatening or hurting you, please tell your healthcare provider.          If you have lab work done today you will be contacted with your lab results within the next 2 weeks.  If you have not heard from [place] then please contact us. The fastest way to get your results is to register for My Chart.   IF you received an x-ray today, you will receive an invoice from [facility] Rad[facility]. Please contact [facility]iology at [phone] with questions or concerns regarding your invoice.   IF you received labwork today, you will receive an invoice from [facility]. Please contact [facility] at [phone] with questions or concerns  regarding your invoice.   Our billing staff will not be able to assist you with questions regarding bills from these companies.  You will be contacted with the lab results as soon as they are available. The fastest way to get your results is to activate your My Chart account. Instructions are located on the last page of this paperwork. If you have not heard from [place] regarding the results in 2 weeks, please contact this office.          Signed, Merri Ray, MD [facility]

## 2019-03-08 NOTE — Patient Instructions (Addendum)
Try lotrisone cream for rash on the arm twice per day until it has resolved.  If that is increasing in size or other areas are involved, let me know.  Thanks for coming in today and take care.  Keeping You Healthy  Get These Tests 1. Blood Pressure- Have your blood pressure checked once a year by your health care provider.  Normal blood pressure is 120/80. 2. Weight- Have your body mass index (BMI) calculated to screen for obesity.  BMI is measure of body fat based on height and weight.  You can also calculate your own BMI at GravelBags.it. 3. Cholesterol- Have your cholesterol checked every 5 years starting at age 56 then yearly starting at age 52. 68. Chlamydia, HIV, and other sexually transmitted diseases- Get screened every year until age 62, then within three months of each new sexual provider. 5. Pap Test - Every 1-5 years; discuss with your health care provider. 6. Mammogram- Every 1-2 years starting at age 61--50  Take these medicines  Calcium with Vitamin D-Your body needs 1200 mg of Calcium each day and [phone] IU of Vitamin D daily.  Your body can only absorb 500 mg of Calcium at a time so Calcium must be taken in 2 or 3 divided doses throughout the day.  Multivitamin with folic acid- Once daily if it is possible for you to become pregnant.  Get these Immunizations  Gardasil-Series of three doses; prevents HPV related illness such as genital warts and cervical cancer.  Menactra-Single dose; prevents meningitis.  Tetanus shot- Every 10 years.  Flu shot-Every year.  Take these steps 1. Do not smoke-Your healthcare provider can help you quit.  For tips on how to quit go to www.smokefree.gov or call 1-800 QUITNOW. 2. Be physically active- Exercise 5 days a week for at least 30 minutes.  If you are not already physically active, start slow and gradually work up to 30 minutes of moderate physical activity.  Examples of moderate activity include walking briskly,  dancing, swimming, bicycling, etc. 3. Breast Cancer- A self breast exam every month is important for early detection of breast cancer.  For more information and instruction on self breast exams, ask your healthcare provider or https://www.patel.info/. 4. Eat a healthy diet- Eat a variety of healthy foods such as fruits, vegetables, whole grains, low fat milk, low fat cheeses, yogurt, lean meats, poultry and fish, beans, nuts, tofu, etc.  For more information go to www. Thenutritionsource.org 5. Drink alcohol in moderation- Limit alcohol intake to one drink or less per day. Never drink and drive. 6. Depression- Your emotional health is as important as your physical health.  If you're feeling down or losing interest in things you normally enjoy please talk to your healthcare provider about being screened for depression. 7. Dental visit- Brush and floss your teeth twice daily; visit your dentist twice a year. 8. Eye doctor- Get an eye exam at least every 2 years. 9. Helmet use- Always wear a helmet when riding a bicycle, motorcycle, rollerblading or skateboarding. 75. Safe sex- If you may be exposed to sexually transmitted infections, use a condom. 11. Seat belts- Seat belts can save your live; always wear one. 12. Smoke/Carbon Monoxide detectors- These detectors need to be installed on the appropriate level of your home. Replace batteries at least once a year. 13. Skin cancer- When out in the sun please cover up and use sunscreen 15 SPF or higher. 14. Violence- If anyone is threatening or hurting you, please tell your  healthcare provider.          If you have lab work done today you will be contacted with your lab results within the next 2 weeks.  If you have not heard from [place] then please contact us. The fastest way to get your results is to register for My Chart.   IF you received an x-ray today, you will receive an invoice from [facility]. Please contact  [facility] Radiology at [phone] with questions or concerns regarding your invoice.   IF you received labwork today, you will receive an invoice from [facility]. Please contact [facility] at [phone] with questions or concerns regarding your invoice.   Our billing staff will not be able to assist you with questions regarding bills from these companies.  You will be contacted with the lab results as soon as they are available. The fastest way to get your results is to activate your My Chart account. Instructions are located on the last page of this paperwork. If you have not heard from [place] regarding the results in 2 weeks, please contact this office.

## 2019-03-09 LAB — LIPID PANEL
Chol/HDL Ratio: 3.5 ratio (ref 0.0–4.4)
Cholesterol, Total: 196 mg/dL (ref 100–199)
HDL: 56 mg/dL (ref >39)
LDL Chol Calc (NIH): 129 mg/dL — ABNORMAL HIGH (ref 0–99)
Triglycerides: 59 mg/dL (ref 0–149)
VLDL Cholesterol Cal: 11 mg/dL (ref 5–40)

## 2019-03-09 LAB — CMP14+EGFR
ALT: 12 IU/L (ref 0–32)
AST: 13 IU/L (ref 0–40)
Albumin/Globulin Ratio: 1.5 (ref 1.2–2.2)
Albumin: 4.3 g/dL (ref 3.8–4.8)
Alkaline Phosphatase: 78 IU/L (ref 39–117)
BUN/Creatinine Ratio: 14 (ref 9–23)
BUN: 12 mg/dL (ref 6–24)
Bilirubin Total: 0.3 mg/dL (ref 0.0–1.2)
CO2: 21 mmol/L (ref 20–29)
Calcium: 9.1 mg/dL (ref 8.7–10.2)
Chloride: 101 mmol/L (ref 96–106)
Creatinine, Ser: 0.83 mg/dL (ref 0.57–1.00)
GFR calc Af Amer: 98 mL/min/{1.73_m2} (ref >59)
GFR calc non Af Amer: 85 mL/min/{1.73_m2} (ref >59)
Globulin, Total: 2.8 g/dL (ref 1.5–4.5)
Glucose: 82 mg/dL (ref 65–99)
Potassium: 4.4 mmol/L (ref 3.5–5.2)
Sodium: 137 mmol/L (ref 134–144)
Total Protein: 7.1 g/dL (ref 6.0–8.5)

## 2019-03-09 LAB — RPR: RPR Ser Ql: NONREACTIVE

## 2019-03-09 LAB — HIV ANTIBODY (ROUTINE TESTING W REFLEX): HIV Screen 4th Generation wRfx: NONREACTIVE

## 2019-03-11 LAB — GC/CHLAMYDIA PROBE AMP (~~LOC~~) NOT AT ARMC
Chlamydia: NEGATIVE
Comment: NEGATIVE
Comment: NORMAL
Neisseria Gonorrhea: NEGATIVE

## 2019-06-28 ENCOUNTER — Encounter: Payer: Self-pay | Admitting: Family Medicine

## 2019-10-07 ENCOUNTER — Other Ambulatory Visit: Payer: Self-pay

## 2019-10-07 ENCOUNTER — Ambulatory Visit: Payer: BC Managed Care – PPO | Admitting: Family Medicine

## 2019-10-07 ENCOUNTER — Encounter: Payer: Self-pay | Admitting: Family Medicine

## 2019-10-07 VITALS — BP 106/71 | HR 85 | Temp 98.2°F | Ht 67.0 in | Wt 223.0 lb

## 2019-10-07 DIAGNOSIS — M549 Dorsalgia, unspecified: Secondary | ICD-10-CM | POA: Diagnosis not present

## 2019-10-07 DIAGNOSIS — B372 Candidiasis of skin and nail: Secondary | ICD-10-CM

## 2019-10-07 MED ORDER — CLOTRIMAZOLE 1 % EX CREA: 1.0000 "application " | TOPICAL_CREAM | Freq: Two times a day (BID) | CUTANEOUS | 2 refills | Status: DC

## 2019-10-07 MED ORDER — NYSTATIN 100000 UNIT/GM EX POWD: Freq: Three times a day (TID) | CUTANEOUS | 2 refills | Status: DC

## 2019-10-07 NOTE — Progress Notes (Signed)
Subjective:  Patient ID: Jessica Mccarthy, female    DOB: 1970/10/18  Age: 49 y.o. MRN: [id number]  CC:  Chief Complaint  Patient presents with   yest infection    Pt states she has a yest infection under both brests. started on 10/04/2019. pt reports no pain in the area, but it is itchy. PT hasn't used any OVC medication to try to help. pt reports this has happened before.     HPI Jessica Mccarthy presents for   Candida intertrigo: Similar symptoms in past, inframammary. Has looked at possibly having breast reduction to minimize some of these issues in the past.  Treated nystatin powder, Diflucan 150 mg x 1, option of clotrimazole cream in July 2020.  Saw surgeon in January, Dr. Harlow Mares. + Request declined. Weight loss was required initially and PT. Trying to approach sustainable weight loss. More sore at times as breast seem same size and frame is smaller. 14# weight loss since 02/2019.   Current symptoms past 4 days, under both breasts.  Bright red. Some puffiness. Used otc fresh breast cream - cream that dries in. That has helped.  Had been in [place] prior -hot/humid. Tried to prevent with nystatin powder daily. No fever.    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 Authorizing Provider  clotrimazole (LOTRIMIN) 1 % cream Apply 1 application topically 2 (two) times daily. 10/01/18  Yes Wendie Agreste, MD  clotrimazole-betamethasone (LOTRISONE) cream Apply 1 application topically 2 (two) times daily. 03/08/19  Yes Wendie Agreste, MD  Lidocaine, Anorectal, 5 % GEL Apply pea-sized amount to the perianal skin up to 3 times per day as needed before bowel  movement. 02/11/19  Yes Wendie Agreste, MD  nystatin (NYSTATIN) powder Apply topically 3 (three) times daily. 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   Married 1999. Legally separated 2017.   Social Determinants of Health   Financial Resource Strain:    Difficulty of Paying Living Expenses:   Food Insecurity:    Worried About Charity fundraiser in the Last Year:    Arboriculturist in the Last Year:   Transportation Needs:    Film/video editor (Medical):    Lack of Transportation (Non-Medical):   Physical Activity:    Days of Exercise per Week:  Minutes of Exercise per Session:   Stress:    Feeling of Stress :   Social Connections:    Frequency of Communication with Friends and Family:    Frequency of Social Gatherings with Friends and Family:    Attends Religious Services:    Active Member of Clubs or Organizations:    Attends Music therapist:    Marital Status:   Intimate Partner Violence:    Fear of Current or Ex-Partner:    Emotionally Abused:    Physically Abused:    Sexually Abused:     Review of Systems  Constitutional: Negative for chills and fever.  Musculoskeletal: Positive for arthralgias (upper back. ).   Other  per hpi  Objective:   Vitals:   10/07/19 1325  BP: 106/71  Pulse: 85  Temp: 98.2 F (36.8 C)  TempSrc: Temporal  SpO2: 95%  Weight: 223 lb (101.2 kg)  Height: 5\' 7"  (1.702 m)   Wt Readings from Last 3 Encounters:  10/07/19 223 lb (101.2 kg)  03/08/19 237 lb 6.4 oz (107.7 kg)  02/11/19 236 lb (107 kg)     Physical Exam Nursing note reviewed. Exam conducted with a chaperone present.  Constitutional:      General: She is not in acute distress.    Appearance: She is well-developed.  HENT:     Head: Normocephalic and atraumatic.  Cardiovascular:     Rate and Rhythm: Normal rate.  Pulmonary:     Effort: Pulmonary effort is normal.  Chest:    Neurological:     Mental Status: She is alert and oriented to person, place, and time.        Assessment & Plan:  [name] is a 49 y.o. female . Upper back pain  Candidiasis, intertrigo - Plan: clotrimazole (LOTRIMIN) 1 % cream, nystatin (NYSTATIN) powder  Recurrent candidiasis/intertrigo.  Improved with some over-the-counter treatment.  Continue nystatin powder, refilled.  Clotrimazole twice per day as needed with flare.  Unfortunate still has some upper back pain due to large breasts/breast hypertrophy.  Planned on reduction surgery, but not yet authorized.  Plan for weight loss.  Has lost weight already as above.  Consider upper back evaluation and treatment by physical therapy specifically to help with discomfort if needed.  Recheck 3 months.  RTC precautions  No orders of the defined types were placed in this encounter.  Patient Instructions       If you have lab work done today you will be contacted with your lab results within the next 2 weeks.  If you have not heard from [place] then please contact us. The fastest way to get your results is to register for My Chart.   IF you received an x-ray today, you will receive an invoice from [facility] Radiology. Please contact [facility] Radiology at [phone] with  questions or concerns regarding your invoice.   IF you received labwork today, you will receive an invoice from [place]. Please contact [facility] at [phone] with questions or concerns regarding your invoice.   Our billing staff will not be able to assist you with questions regarding bills from these companies.  You will be contacted with the lab results as soon as they are available. The fastest way to get your results is to activate your My Chart account. Instructions are located on the last page of this paperwork. If you have not heard from [place] regarding the results in 2 weeks, please contact this office.  Signed, Merri Ray, MD [facility]

## 2019-10-07 NOTE — Patient Instructions (Addendum)
°  Continue nystatin powder 3 times per day, then can add clotrimazole cream for improved treatment of skin yeast infections.  If you continue to have soreness in the upper back, I am happy to refer you to physical therapy to evaluate for specific exercises to help with the discomfort.  Let me know. Can follow-up in the next few months to see how things are going.  Happy to see you sooner if needed.    If you have lab work done today you will be contacted with your lab results within the next 2 weeks.  If you have not heard from [place] then please contact us. The fastest way to get your results is to register for My Chart.   IF you received an x-ray today, you will receive an invoice from [facility]. Please contact [facility] at [phone] with questions or concerns regarding your invoice.   IF you received labwork today, you will receive an invoice from [facility]. Please contact [facility] at [phone] with questions or concerns regarding your invoice.   Our billing staff will not be able to assist you with questions regarding bills from these companies.  You will be contacted with the lab results as soon as they are available. The fastest way to get your results is to activate your My Chart account. Instructions are located on the last page of this paperwork. If you have not heard from [place] regarding the results in 2 weeks, please contact this office.

## 2020-01-17 ENCOUNTER — Other Ambulatory Visit: Payer: Self-pay | Admitting: Obstetrics and Gynecology

## 2020-01-17 DIAGNOSIS — Z1231 Encounter for screening mammogram for malignant neoplasm of breast: Secondary | ICD-10-CM

## 2020-02-28 ENCOUNTER — Ambulatory Visit
Admission: RE | Admit: 2020-02-28 | Discharge: 2020-02-28 | Disposition: A | Discharge status: Home / Self Care | Payer: BC Managed Care – PPO | Source: Ambulatory Visit | Attending: Obstetrics and Gynecology | Admitting: Obstetrics and Gynecology

## 2020-02-28 ENCOUNTER — Other Ambulatory Visit: Payer: Self-pay

## 2020-02-28 DIAGNOSIS — Z1231 Encounter for screening mammogram for malignant neoplasm of breast: Secondary | ICD-10-CM

## 2020-11-10 LAB — HM COLONOSCOPY

## 2021-07-26 ENCOUNTER — Ambulatory Visit: Payer: BC Managed Care – PPO | Admitting: Family Medicine

## 2021-07-26 ENCOUNTER — Encounter: Payer: Self-pay | Admitting: Family Medicine

## 2021-07-26 VITALS — BP 132/70 | HR 69 | Temp 98.0°F | Resp 16 | Ht 67.0 in | Wt 234.4 lb

## 2021-07-26 DIAGNOSIS — L509 Urticaria, unspecified: Secondary | ICD-10-CM | POA: Diagnosis not present

## 2021-07-26 DIAGNOSIS — R21 Rash and other nonspecific skin eruption: Secondary | ICD-10-CM | POA: Diagnosis not present

## 2021-07-26 NOTE — Patient Instructions (Addendum)
Your rash was likely allergic and appears to have been hives. Since better, should not need further prednisone.  ?Start xyzal or zyrtec once per day for next week or two. ?ok to use benadryl at bedtime for now, but can taper off the benadryl in the next few days.  ?Ok to use zantac or pepcid for the next week or two - take at bedtime.  ?Return to the clinic or go to the nearest emergency room if any of your symptoms worsen or new symptoms occur. ? ?Hives ?Hives (urticaria) are itchy, red, swollen areas on the skin. Hives can appear on any part of the body. Hives often fade within 24 hours (acute hives). Sometimes, new hives appear after old ones fade and the cycle can continue for several days or weeks (chronic hives). Hives do not spread from person to person (are not contagious). ?Hives come from the body's reaction to something a person is allergic to (allergen), something that causes irritation, or various other triggers. When a person is exposed to a trigger, his or her body releases a chemical (histamine) that causes redness, itching, and swelling. Hives can appear right after exposure to a trigger or hours later. ?What are the causes? ?This condition may be caused by: ?Allergies to foods or ingredients. ?Insect bites or stings. ?Exposure to pollen or pets. ?Spending time in sunlight, heat, or cold (exposure). ?Exercise. ?Stress. ?You can also get hives from other medical conditions and treatments, such as: ?Viruses, including the common cold. ?Bacterial infections, such as urinary tract infections and strep throat. ?Certain medicines. ?Contact with latex or chemicals. ?Allergy shots. ?Blood transfusions. ?Sometimes, the cause of this condition is not known (idiopathic hives). ?What increases the risk? ?You are more likely to develop this condition if you: ?Are a woman. ?Have food allergies, especially to citrus fruits, milk, eggs, peanuts, tree nuts, or shellfish. ?Are allergic  to: ?Medicines. ?Latex. ?Insects. ?Animals. ?Pollen. ?What are the signs or symptoms? ?Common symptoms of this condition include raised, itchy, red or white bumps or patches on your skin. These areas may: ?Become large and swollen (welts). ?Change in shape and location, quickly and repeatedly. ?Be separate hives or connect over a large area of skin. ?Sting or become painful. ?Turn white when pressed in the center (blanch). ?In severe cases, your hands, feet, and face may also become swollen. This may occur if hives develop deeper in your skin. ?How is this diagnosed? ?This condition may be diagnosed by your symptoms, medical history, and physical exam. ?Your skin, urine, or blood may be tested to find out what is causing your hives and to rule out other health issues. ?Your health care provider may also remove a small sample of skin from the affected area and examine it under a microscope (biopsy). ?How is this treated? ?Treatment for this condition depends on the cause and severity of your symptoms. Your health care provider may recommend using cool, wet cloths (cool compresses) or taking cool showers to relieve itching. Treatment may include: ?Medicines that help: ?Relieve itching (antihistamines). ?Reduce swelling (corticosteroids). ?Treat infection (antibiotics). ?An injectable medicine (omalizumab). Your health care provider may prescribe this if you have chronic idiopathic hives and you continue to have symptoms even after treatment with antihistamines. ?Severe cases may require an emergency injection of adrenaline (epinephrine) to prevent a life-threatening allergic reaction (anaphylaxis). ?Follow these instructions at home: ?Medicines ?Take and apply over-the-counter and prescription medicines only as told by your health care provider. ?If you were prescribed an antibiotic medicine,  take it as told by your health care provider. Do not stop using the antibiotic even if you start to feel better. ?Skin  care ?Apply cool compresses to the affected areas. ?Do not scratch or rub your skin. ?General instructions ?Do not take hot showers or baths. This can make itching worse. ?Do not wear tight-fitting clothing. ?Use sunscreen and wear protective clothing when you are outside. ?Avoid any substances that cause your hives. Keep a journal to help track what causes your hives. Write down: ?What medicines you take. ?What you eat and drink. ?What products you use on your skin. ?Keep all follow-up visits as told by your health care provider. This is important. ?Contact a health care provider if: ?Your symptoms are not controlled with medicine. ?Your joints are painful or swollen. ?Get help right away if: ?You have a fever. ?You have pain in your abdomen. ?Your tongue or lips are swollen. ?Your eyelids are swollen. ?Your chest or throat feels tight. ?You have trouble breathing or swallowing. ?These symptoms may represent a serious problem that is an emergency. Do not wait to see if the symptoms will go away. Get medical help right away. Call your local emergency services (911 in the [place] Do not drive yourself to the hospital. ?Summary ?Hives (urticaria) are itchy, red, swollen areas on your skin. Hives come from the body's reaction to something a person is allergic to (allergen), something that causes irritation, or various other triggers. ?Treatment for this condition depends on the cause and severity of your symptoms. ?Avoid any substances that cause your hives. Keep a journal to help track what causes your hives. ?Take and apply over-the-counter and prescription medicines only as told by your health care provider. ?Get help right away if your chest or throat feels tight or if you have trouble breathing or swallowing. ?This information is not intended to replace advice given to you by your health care provider. Make sure you discuss any questions you have with your health care provider. ?Document Revised: 04/26/2020  Document Reviewed: 04/26/2020 ?Elsevier Patient Education ? [place]. ? ?

## 2021-07-26 NOTE — Progress Notes (Signed)
? ?Subjective:  ?Patient ID: Jessica Mccarthy, female    DOB: May 14, 1970  Age: 51 y.o. MRN: [id number] ? ?CC:  ?Chief Complaint  ?Patient presents with  ? Rash  ?  Starting Thursday and had several spots of rashes, took benadryl and hydrocortisone cream, went to UC on sat was given steroid 3 tablets 3 days  notes once one spot clears another spot will pop up, took 2 tablets of steroid and notes this has been the most relief she has gotten, no new products on skin or in home.   ? ? ?HPI ?Jessica Mccarthy presents for  ? ?Rash: ?Started approximately 4 days ago.  Various areas. Initial chest, arms.  Initially treatment with Benadryl, hydrocortisone cream - helped temporarily.  Came back - legs, back. Went to urgent care 3 days ago, [facility], note reviewed.  Diffuse pruritic rash reported mild raised red macular/papular rash.  No known exposures.  Exam at that time indicated a red maculopapular rash of left chest and low back.  Treated with prednisone 30 mg daily for 3 days.  Benadryl and PCP follow-up. ?1 prednisone left.  ?Rash has resolved. On benadryl at night.  ?No oral or genital lesions.  ?No recent antibiotics. No new Rx meds, no new supplements - just B12, herbal meds, dermatologic products, creams, or detergents.  ?Last travel 2 weeks ago.  ?No new foods.  ?Photo on her phone  - urticarial lesions.  ?Used steroid cream on face prior - none today.  ? ?History ?Patient Active Problem List  ? Diagnosis Date Noted  ? 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 ?Past Medical History:  ?Diagnosis Date  ? S/P partial hysterectomy   ? ?Past Surgical History:  ?Procedure Laterality Date  ? ABDOMINAL HYSTERECTOMY    ? due to fibroids  ? ?No Known Allergies ?Prior to Admission medications   ?Medication Sig Start Date End Date Taking? Authorizing Provider  ?clotrimazole (LOTRIMIN) 1 % cream Apply 1 application topically 2 (two) times daily. 10/07/19  Yes Wendie Agreste, MD  ?clotrimazole-betamethasone  (LOTRISONE) cream Apply 1 application topically 2 (two) times daily. 03/08/19  Yes Wendie Agreste, MD  ?Lidocaine, Anorectal, 5 % GEL Apply pea-sized amount to the perianal skin up to 3 times per day as needed before bowel movement. 02/11/19  Yes Wendie Agreste, MD  ?nystatin (NYSTATIN) powder Apply topically 3 (three) times daily. 10/07/19  Yes Wendie Agreste, MD  ? ?Social History  ? ?Socioeconomic History  ? Marital status: Legally Separated  ?  Spouse name: Not on file  ? Number of children: 1  ? Years of education: College  ? Highest education level: Not on file  ?Occupational History  ? Occupation: Scientist, physiological  ?  Employer: [employer]  ?  Comment: [employer] Office  ?Tobacco Use  ? Smoking status: Never  ? Smokeless tobacco: Never  ?Vaping Use  ? Vaping Use: Never used  ?Substance and Sexual Activity  ? Alcohol use: Yes  ?  Alcohol/week: 0.0 - 1.0 standard drinks  ? Drug use: No  ? Sexual activity: Yes  ?  Partners: Male  ?  Birth control/protection: Surgical  ?Other Topics Concern  ? Not on file  ?Social History Narrative  ? Daughter lives with her.  ? Married 1999. Legally separated 2017.  ? ?Social Determinants of Health  ? ?Financial Resource Strain: Not on file  ?Food Insecurity: Not on file  ?Transportation Needs: Not on file  ?Physical Activity: Not on  file  ?Stress: Not on file  ?Social Connections: Not on file  ?Intimate Partner Violence: Not on file  ? ? ?Review of Systems ? ? ?Objective:  ? ?Vitals:  ? 07/26/21 1152  ?BP: 132/70  ?Pulse: 69  ?Resp: 16  ?Temp: 98 ?F (36.7 ?C)  ?TempSrc: Temporal  ?SpO2: 98%  ?Weight: 234 lb 6.4 oz (106.3 kg)  ?Height: '5\' 7"'$  (1.702 m)  ? ? ? ?Physical Exam ?Constitutional:   ?   General: She is not in acute distress. ?   Appearance: Normal appearance. She is well-developed.  ?HENT:  ?   Head: Normocephalic and atraumatic.  ?Cardiovascular:  ?   Rate and Rhythm: Normal rate.  ?Pulmonary:  ?   Effort: Pulmonary effort is normal. No respiratory distress.  ?    Breath sounds: Normal breath sounds. No stridor. No wheezing.  ?Skin: ?   General: Skin is warm and dry.  ?   Findings: No rash.  ?Neurological:  ?   Mental Status: She is alert and oriented to person, place, and time.  ?Psychiatric:     ?   Mood and Affect: Mood normal.  ? ? ?31 minutes spent during visit, including chart review, urgent care note review, counseling and assimilation of information, exam, discussion of plan, and chart completion.  ? ? ? ?Assessment & Plan:  ?Jessica Mccarthy is a 51 y.o. female . ?Rash and nonspecific skin eruption ? ?Urticaria ?By history and photos on the phone likely urticarial rash, unknown cause.  No genital or oral involvement.  No stridor or respiratory symptoms.  Improving since treatment with Benadryl, prednisone. ? -Start Zyrtec or Xyzal, option of Pepcid or Zantac at bedtime.  Continue for the next 1 to 2 weeks and if stable can taper off meds at that time.  Can continue Benadryl for the next few nights but taper off that medication if still doing well.  If any recurrence of symptoms urgent care/follow-up in office and at that point would recommend allergy referral/testing.  ER precautions/RTC precautions given. ? ?No orders of the defined types were placed in this encounter. ? ?Patient Instructions  ?Your rash was likely allergic and appears to have been hives. Since better, should not need further prednisone.  ?Start xyzal or zyrtec once per day for next week or two. ?ok to use benadryl at bedtime for now, but can taper off the benadryl in the next few days.  ?Ok to use zantac or pepcid for the next week or two - take at bedtime.  ?Return to the clinic or go to the nearest emergency room if any of your symptoms worsen or new symptoms occur. ? ?Hives ?Hives (urticaria) are itchy, red, swollen areas on the skin. Hives can appear on any part of the body. Hives often fade within 24 hours (acute hives). Sometimes, new hives appear after old ones fade and the cycle can continue  for several days or weeks (chronic hives). Hives do not spread from person to person (are not contagious). ?Hives come from the body's reaction to something a person is allergic to (allergen), something that causes irritation, or various other triggers. When a person is exposed to a trigger, his or her body releases a chemical (histamine) that causes redness, itching, and swelling. Hives can appear right after exposure to a trigger or hours later. ?What are the causes? ?This condition may be caused by: ?Allergies to foods or ingredients. ?Insect bites or stings. ?Exposure to pollen or pets. ?Spending time in sunlight,  heat, or cold (exposure). ?Exercise. ?Stress. ?You can also get hives from other medical conditions and treatments, such as: ?Viruses, including the common cold. ?Bacterial infections, such as urinary tract infections and strep throat. ?Certain medicines. ?Contact with latex or chemicals. ?Allergy shots. ?Blood transfusions. ?Sometimes, the cause of this condition is not known (idiopathic hives). ?What increases the risk? ?You are more likely to develop this condition if you: ?Are a woman. ?Have food allergies, especially to citrus fruits, milk, eggs, peanuts, tree nuts, or shellfish. ?Are allergic to: ?Medicines. ?Latex. ?Insects. ?Animals. ?Pollen. ?What are the signs or symptoms? ?Common symptoms of this condition include raised, itchy, red or white bumps or patches on your skin. These areas may: ?Become large and swollen (welts). ?Change in shape and location, quickly and repeatedly. ?Be separate hives or connect over a large area of skin. ?Sting or become painful. ?Turn white when pressed in the center (blanch). ?In severe cases, your hands, feet, and face may also become swollen. This may occur if hives develop deeper in your skin. ?How is this diagnosed? ?This condition may be diagnosed by your symptoms, medical history, and physical exam. ?Your skin, urine, or blood may be tested to find out  what is causing your hives and to rule out other health issues. ?Your health care provider may also remove a small sample of skin from the affected area and examine it under a microscope (biopsy). ?How is

## 2021-11-01 ENCOUNTER — Other Ambulatory Visit: Payer: Self-pay | Admitting: Obstetrics and Gynecology

## 2021-11-01 DIAGNOSIS — Z1231 Encounter for screening mammogram for malignant neoplasm of breast: Secondary | ICD-10-CM

## 2021-11-12 ENCOUNTER — Ambulatory Visit
Admission: RE | Admit: 2021-11-12 | Discharge: 2021-11-12 | Disposition: A | Discharge status: Home / Self Care | Payer: BC Managed Care – PPO | Source: Ambulatory Visit | Attending: Obstetrics and Gynecology | Admitting: Obstetrics and Gynecology

## 2021-11-12 DIAGNOSIS — Z1231 Encounter for screening mammogram for malignant neoplasm of breast: Secondary | ICD-10-CM

## 2022-03-30 ENCOUNTER — Ambulatory Visit (INDEPENDENT_AMBULATORY_CARE_PROVIDER_SITE_OTHER): Payer: BC Managed Care – PPO | Admitting: Family Medicine

## 2022-03-30 ENCOUNTER — Encounter: Payer: Self-pay | Admitting: Family Medicine

## 2022-03-30 VITALS — BP 128/70 | HR 86 | Temp 98.4°F | Ht 67.0 in | Wt 235.8 lb

## 2022-03-30 DIAGNOSIS — Z0000 Encounter for general adult medical examination without abnormal findings: Secondary | ICD-10-CM | POA: Diagnosis not present

## 2022-03-30 DIAGNOSIS — B372 Candidiasis of skin and nail: Secondary | ICD-10-CM

## 2022-03-30 DIAGNOSIS — Z23 Encounter for immunization: Secondary | ICD-10-CM

## 2022-03-30 DIAGNOSIS — Z131 Encounter for screening for diabetes mellitus: Secondary | ICD-10-CM

## 2022-03-30 DIAGNOSIS — Z6836 Body mass index (BMI) 36.0-36.9, adult: Secondary | ICD-10-CM

## 2022-03-30 DIAGNOSIS — Z13220 Encounter for screening for lipoid disorders: Secondary | ICD-10-CM

## 2022-03-30 DIAGNOSIS — Z1159 Encounter for screening for other viral diseases: Secondary | ICD-10-CM

## 2022-03-30 MED ORDER — CLOTRIMAZOLE 1 % EX CREA: 1.0000 | TOPICAL_CREAM | Freq: Two times a day (BID) | CUTANEOUS | 2 refills | Status: AC

## 2022-03-30 MED ORDER — NYSTATIN 100000 UNIT/GM EX POWD: Freq: Three times a day (TID) | CUTANEOUS | 2 refills | Status: AC

## 2022-03-30 NOTE — Patient Instructions (Addendum)
I would consider physical therapy for back or shoulder soreness, let me know. Please follow up to discuss soreness and plan further. If you do have a flare of the rash. Ok to start cream if the rash under breast recurs.   Here is an option for weight loss locally, but happy to meet with you to discuss this further if needed.  [facility]  [phone]  Address for fasting labs: [facility] in 8:30-4:30 during weekdays, no appointment needed [address].  [place], [place] [address]   Take care!   Preventive Care 40-1 Years Old, Female Preventive care refers to lifestyle choices and visits with your health care provider that can promote health and wellness. Preventive care visits are also called wellness exams. What can I expect for my preventive care visit? Counseling Your health care provider may ask you questions about your: Medical history, including: Past medical problems. Family medical history. Pregnancy history. Current health, including: Menstrual cycle. Method of birth control. Emotional well-being. Home life and relationship well-being. Sexual activity and sexual health. Lifestyle, including: Alcohol, nicotine or tobacco, and drug use. Access to firearms. Diet, exercise, and sleep habits. Work and work Statistician. Sunscreen use. Safety issues such as seatbelt and bike helmet use. Physical exam Your health care provider will check your: Height and weight. These may be used to calculate your BMI (body mass index). BMI is a measurement that tells if you are at a healthy weight. Waist circumference. This measures the distance around your waistline. This measurement also tells if you are at a healthy weight and may help predict your risk of certain diseases, such as type 2 diabetes and high blood pressure. Heart rate and blood pressure. Body temperature. Skin for abnormal spots. What immunizations do I  need?  Vaccines are usually given at various ages, according to a schedule. Your health care provider will recommend vaccines for you based on your age, medical history, and lifestyle or other factors, such as travel or where you work. What tests do I need? Screening Your health care provider may recommend screening tests for certain conditions. This may include: Lipid and cholesterol levels. Diabetes screening. This is done by checking your blood sugar (glucose) after you have not eaten for a while (fasting). Pelvic exam and Pap test. Hepatitis B test. Hepatitis C test. HIV (human immunodeficiency virus) test. STI (sexually transmitted infection) testing, if you are at risk. Lung cancer screening. Colorectal cancer screening. Mammogram. Talk with your health care provider about when you should start having regular mammograms. This may depend on whether you have a family history of breast cancer. BRCA-related cancer screening. This may be done if you have a family history of breast, ovarian, tubal, or peritoneal cancers. Bone density scan. This is done to screen for osteoporosis. Talk with your health care provider about your test results, treatment options, and if necessary, the need for more tests. Follow these instructions at home: Eating and drinking  Eat a diet that includes fresh fruits and vegetables, whole grains, lean protein, and low-fat dairy products. Take vitamin and mineral supplements as recommended by your health care provider. Do not drink alcohol if: Your health care provider tells you not to drink. You are pregnant, may be pregnant, or are planning to become pregnant. If you drink alcohol: Limit how much you have to 0-1 drink a day. Know how much alcohol is in your drink. In the [place] one drink equals one 12 oz  bottle of beer (355 mL), one 5 oz glass of wine (148 mL), or one 1 oz glass of hard liquor (44 mL). Lifestyle Brush your teeth every morning and night with  fluoride toothpaste. Floss one time each day. Exercise for at least 30 minutes 5 or more days each week. Do not use any products that contain nicotine or tobacco. These products include cigarettes, chewing tobacco, and vaping devices, such as e-cigarettes. If you need help quitting, ask your health care provider. Do not use drugs. If you are sexually active, practice safe sex. Use a condom or other form of protection to prevent STIs. If you do not wish to become pregnant, use a form of birth control. If you plan to become pregnant, see your health care provider for a prepregnancy visit. Take aspirin only as told by your health care provider. Make sure that you understand how much to take and what form to take. Work with your health care provider to find out whether it is safe and beneficial for you to take aspirin daily. Find healthy ways to manage stress, such as: Meditation, yoga, or listening to music. Journaling. Talking to a trusted person. Spending time with friends and family. Minimize exposure to UV radiation to reduce your risk of skin cancer. Safety Always wear your seat belt while driving or riding in a vehicle. Do not drive: If you have been drinking alcohol. Do not ride with someone who has been drinking. When you are tired or distracted. While texting. If you have been using any mind-altering substances or drugs. Wear a helmet and other protective equipment during sports activities. If you have firearms in your house, make sure you follow all gun safety procedures. Seek help if you have been physically or sexually abused. What's next? Visit your health care provider once a year for an annual wellness visit. Ask your health care provider how often you should have your eyes and teeth checked. Stay up to date on all vaccines. This information is not intended to replace advice given to you by your health care provider. Make sure you discuss any questions you have with your health  care provider. Document Revised: 09/02/2020 Document Reviewed: 09/02/2020 Elsevier Patient Education  [place].

## 2022-03-30 NOTE — Progress Notes (Signed)
Subjective:  Patient ID: Jessica Mccarthy, female    DOB: 02-10-1971  Age: 52 y.o. MRN: [id number]  CC:  Chief Complaint  Patient presents with   Annual Exam    Pt not fasting     HPI Jessica Mccarthy presents for Annual Exam  Last seen in May 2023 with urticaria. No recurrence.   No change in health. Doing well.  Has seen 2 providers due to large breast concerns. Has seen 2 surgeons, surgery has been denied by insurance. Shoulder and back soreness. Has not met with PT. Prior yeast infections under breast. Has been able to use cream at home - no documentation of these occurrences. Uses nystatin powder during warm season, antifungal cream, and over the counter treatments.   Tired at end of year, no depression sx's.  Considering meeting with weight loss specialist.   Lab Results  Component Value Date   CHOL 196 03/08/2019   HDL 56 03/08/2019   LDLCALC 129 (H) 03/08/2019   TRIG 59 03/08/2019   CHOLHDL 3.5 03/08/2019  No results found for: "HGBA1C" Wt Readings from Last 3 Encounters:  03/30/22 235 lb 12.8 oz (107 kg)  07/26/21 234 lb 6.4 oz (106.3 kg)  10/07/19 223 lb (101.2 kg)  Body mass index is 36.93 kg/m.       03/30/2022    9:28 AM 07/26/2021   11:54 AM 10/07/2019    1:26 PM 03/08/2019    8:13 AM 02/11/2019    3:48 PM  Depression screen PHQ 2/9  Decreased Interest 1 0 0 0 0  Down, Depressed, Hopeless 0 0 0 0 0  PHQ - 2 Score 1 0 0 0 0  Altered sleeping 0      Tired, decreased energy 1      Change in appetite 0      Feeling bad or failure about yourself  0      Trouble concentrating 0      Moving slowly or fidgety/restless 0      Suicidal thoughts 0      PHQ-9 Score 2        Health Maintenance  Topic Date Due   COVID-19 Vaccine (2 - 2023-24 season) 04/15/2022 (Originally 11/19/2021)   COLONOSCOPY (Pts 45-67yr Insurance coverage will need to be confirmed)  07/27/2022 (Originally 06/20/2017)   Hepatitis C Screening  07/27/2022 (Originally 06/21/1990)   DTaP/Tdap/Td  (2 - Td or Tdap) 03/07/2029   INFLUENZA VACCINE  Completed   HIV Screening  Completed   HPV VACCINES  Aged Out  Flu vaccine today.  Covid booster - declines.  Colonoscopy 2 years ago. ? [facility] - repeat 137yr  Hep C screen today.   Immunization History  Administered Date(s) Administered   Influenza,inj,Quad PF,6+ Mos 03/30/2022   Influenza-Unspecified 12/20/2015   Janssen (J&J) SARS-COV-2 Vaccination 06/28/2019   Tdap 03/08/2019   No results found. Wears glasses, appt with optho in past year  Dental:Within Last 6 months  Alcohol: less than 1 per week.   Tobacco: none  Exercise: no regular exercise currently.   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 Authorizing Provider  clotrimazole (LOTRIMIN) 1 % cream  Apply 1 application topically 2 (two) times daily. 10/07/19  Yes Wendie Agreste, MD  clotrimazole-betamethasone (LOTRISONE) cream Apply 1 application topically 2 (two) times daily. 03/08/19  Yes Wendie Agreste, MD  nystatin (NYSTATIN) powder Apply topically 3 (three) times daily. 10/07/19  Yes Wendie Agreste, MD  Lidocaine, Anorectal, 5 % GEL Apply pea-sized amount to the perianal skin up to 3 times per day as needed before bowel movement. Patient not taking: Reported on 03/30/2022 02/11/19   Wendie Agreste, MD   Social History   Socioeconomic History   Marital status: Legally Separated    Spouse name: Not on file   Number of children: 1   Years of education: College   Highest education level: Not on file  Occupational History   Occupation: Technical brewer: [employer]    Comment: [employer] Office  Tobacco Use    Smoking status: Never   Smokeless tobacco: Never  Vaping Use   Vaping Use: Never used  Substance and Sexual Activity   Alcohol use: Yes    Alcohol/week: 0.0 - 1.0 standard drinks of alcohol   Drug use: No   Sexual activity: Yes    Partners: Male    Birth control/protection: Surgical  Other Topics Concern   Not on file  Social History Narrative   Daughter lives with her.   Married 1999. Legally separated 2017.   Social Determinants of Health   Financial Resource Strain: Not on file  Food Insecurity: Not on file  Transportation Needs: Not on file  Physical Activity: Not on file  Stress: Not on file  Social Connections: Not on file  Intimate Partner Violence: Not on file    Review of Systems  13 point review of systems per patient health survey noted.  Negative other than as indicated above or in HPI.   Objective:   Vitals:   03/30/22 0931  BP: 128/70  Pulse: 86  Temp: 98.4 F (36.9 C)  TempSrc: Oral  SpO2: 98%  Weight: 235 lb 12.8 oz (107 kg)  Height: '5\' 7"'$  (1.702 m)     Physical Exam Constitutional:      Appearance: She is well-developed.  HENT:     Head: Normocephalic and atraumatic.     Right Ear: External ear normal.     Left Ear: External ear normal.  Eyes:     Conjunctiva/sclera: Conjunctivae normal.     Pupils: Pupils are equal, round, and reactive to light.  Neck:     Thyroid: No thyromegaly.  Cardiovascular:     Rate and Rhythm: Normal rate and regular rhythm.     Heart sounds: Normal heart sounds. No murmur heard. Pulmonary:     Effort: Pulmonary effort is normal. No respiratory distress.     Breath sounds: Normal breath sounds. No wheezing.  Abdominal:     General: Bowel sounds are normal.     Palpations: Abdomen is soft.     Tenderness: There is no abdominal tenderness.  Musculoskeletal:        General: No tenderness. Normal range of motion.     Cervical back: Normal range of motion and neck supple.     Comments: Slight spasm of the  trapezius, paraspinals upper back.  No midline bony tenderness cervical or thoracic spine  Lymphadenopathy:     Cervical: No cervical adenopathy.  Skin:    General: Skin is warm and dry.     Findings: No rash.  Neurological:     Mental  Status: She is alert and oriented to person, place, and time.  Psychiatric:        Behavior: Behavior normal.        Thought Content: Thought content normal.    Assessment & Plan:  Jessica Mccarthy is a 52 y.o. female . Annual physical exam  - -anticipatory guidance as below in AVS, screening labs above. Health maintenance items as above in HPI discussed/recommended as applicable.   Candidiasis, intertrigo - Plan: clotrimazole (LOTRIMIN) 1 % cream, nystatin powder  -History of intertrigo, nystatin powder, clotrimazole cream if needed with RTC precautions during acute flare.  -Has met with surgery due to issues with breast size and other symptoms as above.  Would consider physical therapy eval for upper back pain/muscular discomfort which may be related to that condition.  Can follow-up to discuss further if needed.  Flu vaccine need - Plan: Flu Vaccine QUAD 6+ mos PF IM (Fluarix Quad PF)  Screening for diabetes mellitus - Plan: Comprehensive metabolic panel, Hemoglobin A1c  Screening for hyperlipidemia - Plan: Comprehensive metabolic panel, Lipid panel  BMI 36.0-36.9,adult  -Number provided for weight loss specialist, can also follow-up to discuss other treatment options if she would like.  Hold on new meds for now.  Check labs above.  Need for hepatitis C screening test - Plan: Hepatitis C antibody  Plan for fasting lab.   Meds ordered this encounter  Medications   clotrimazole (LOTRIMIN) 1 % cream    Sig: Apply 1 Application topically 2 (two) times daily.    Dispense:  30 g    Refill:  2   nystatin powder    Sig: Apply topically 3 (three) times daily.    Dispense:  30 g    Refill:  2   Patient Instructions  I would consider physical  therapy for back or shoulder soreness, let me know. Please follow up to discuss soreness and plan further. If you do have a flare of the rash. Ok to start cream if the rash under breast recurs.   Here is an option for weight loss locally, but happy to meet with you to discuss this further if needed.  [facility]  [phone]  Address for fasting labs:  [facility] in 8:30-4:30 during weekdays, no appointment needed [address].  [place],  [address]   Take care!   Preventive Care 61-63 Years Old, Female Preventive care refers to lifestyle choices and visits with your health care provider that can promote health and wellness. Preventive care visits are also called wellness exams. What can I expect for my preventive care visit? Counseling Your health care provider may ask you questions about your: Medical history, including: Past medical problems. Family medical history. Pregnancy history. Current health, including: Menstrual cycle. Method of birth control. Emotional well-being. Home life and relationship well-being. Sexual activity and sexual health. Lifestyle, including: Alcohol, nicotine or tobacco, and drug use. Access to firearms. Diet, exercise, and sleep habits. Work and work Statistician. Sunscreen use. Safety issues such as seatbelt and bike helmet use. Physical exam Your health care provider will check your: Height and weight. These may be used to calculate your BMI (body mass index). BMI is a measurement that tells if you are at a healthy weight. Waist circumference. This measures the distance around your waistline. This measurement also tells if you are at a healthy weight and may help predict your risk of certain diseases, such as type 2 diabetes and high  blood pressure. Heart rate and blood pressure. Body temperature. Skin for abnormal spots. What immunizations do I need?  Vaccines are usually given  at various ages, according to a schedule. Your health care provider will recommend vaccines for you based on your age, medical history, and lifestyle or other factors, such as travel or where you work. What tests do I need? Screening Your health care provider may recommend screening tests for certain conditions. This may include: Lipid and cholesterol levels. Diabetes screening. This is done by checking your blood sugar (glucose) after you have not eaten for a while (fasting). Pelvic exam and Pap test. Hepatitis B test. Hepatitis C test. HIV (human immunodeficiency virus) test. STI (sexually transmitted infection) testing, if you are at risk. Lung cancer screening. Colorectal cancer screening. Mammogram. Talk with your health care provider about when you should start having regular mammograms. This may depend on whether you have a family history of breast cancer. BRCA-related cancer screening. This may be done if you have a family history of breast, ovarian, tubal, or peritoneal cancers. Bone density scan. This is done to screen for osteoporosis. Talk with your health care provider about your test results, treatment options, and if necessary, the need for more tests. Follow these instructions at home: Eating and drinking  Eat a diet that includes fresh fruits and vegetables, whole grains, lean protein, and low-fat dairy products. Take vitamin and mineral supplements as recommended by your health care provider. Do not drink alcohol if: Your health care provider tells you not to drink. You are pregnant, may be pregnant, or are planning to become pregnant. If you drink alcohol: Limit how much you have to 0-1 drink a day. Know how much alcohol is in your drink. In the [place] one drink equals one 12 oz bottle of beer (355 mL), one 5 oz glass of wine (148 mL), or one 1 oz glass of hard liquor (44 mL). Lifestyle Brush your teeth every morning and night with fluoride toothpaste. Floss one time  each day. Exercise for at least 30 minutes 5 or more days each week. Do not use any products that contain nicotine or tobacco. These products include cigarettes, chewing tobacco, and vaping devices, such as e-cigarettes. If you need help quitting, ask your health care provider. Do not use drugs. If you are sexually active, practice safe sex. Use a condom or other form of protection to prevent STIs. If you do not wish to become pregnant, use a form of birth control. If you plan to become pregnant, see your health care provider for a prepregnancy visit. Take aspirin only as told by your health care provider. Make sure that you understand how much to take and what form to take. Work with your health care provider to find out whether it is safe and beneficial for you to take aspirin daily. Find healthy ways to manage stress, such as: Meditation, yoga, or listening to music. Journaling. Talking to a trusted person. Spending time with friends and family. Minimize exposure to UV radiation to reduce your risk of skin cancer. Safety Always wear your seat belt while driving or riding in a vehicle. Do not drive: If you have been drinking alcohol. Do not ride with someone who has been drinking. When you are tired or distracted. While texting. If you have been using any mind-altering substances or drugs. Wear a helmet and other protective equipment during sports activities. If you have firearms in your house, make sure you follow all gun safety procedures.  Seek help if you have been physically or sexually abused. What's next? Visit your health care provider once a year for an annual wellness visit. Ask your health care provider how often you should have your eyes and teeth checked. Stay up to date on all vaccines. This information is not intended to replace advice given to you by your health care provider. Make sure you discuss any questions you have with your health care provider. Document Revised:  09/02/2020 Document Reviewed: 09/02/2020 Elsevier Patient Education  [place],   Merri Ray, MD [place] 03/30/22 10:19 AM

## 2022-05-16 ENCOUNTER — Ambulatory Visit: Payer: BC Managed Care – PPO | Admitting: Family Medicine

## 2022-05-16 ENCOUNTER — Encounter: Payer: Self-pay | Admitting: Family Medicine

## 2022-05-16 VITALS — BP 118/76 | HR 81 | Temp 98.6°F | Ht 67.0 in | Wt 237.6 lb

## 2022-05-16 DIAGNOSIS — R0981 Nasal congestion: Secondary | ICD-10-CM

## 2022-05-16 LAB — POCT INFLUENZA A/B
Influenza A, POC: NEGATIVE
Influenza B, POC: NEGATIVE

## 2022-05-16 LAB — POC COVID19 BINAXNOW: SARS Coronavirus 2 Ag: NEGATIVE

## 2022-05-16 NOTE — Progress Notes (Signed)
Subjective:  Patient ID: Jessica Mccarthy, female    DOB: 1970/03/24  Age: 52 y.o. MRN: [id number]  CC:  Chief Complaint  Patient presents with   Nasal Congestion    Pt notes headache last two days, pressure in her face including behind the eyes, begain Friday evening     HPI [name] presents for   Nasal congestion: Past 2 days, pressure behind eyes. Scratchy mouth, throat, itchy eyes and congestion. Felt like allergies. Tx: allergy pill - otc allergy relief.  HA this morning - top of head and behind R eye.  Thick yellow green mucus in the morning - then clears during the day.  No fevers. No sick contacts. No home covid testing.  Feels better during day. Worse at night and in the morning.  Sinex ns past 3 days.   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 Authorizing Provider  clotrimazole (LOTRIMIN) 1 % cream Apply 1 Application topically 2 (two) times daily. 03/30/22  Yes Wendie Agreste, MD  nystatin powder Apply topically 3 (three) times daily. 03/30/22  Yes Wendie Agreste, MD  Lidocaine, Anorectal, 5 % GEL Apply pea-sized amount to the perianal skin up to 3 times per day as needed before bowel movement. Patient not taking: Reported on 03/30/2022 02/11/19   Wendie Agreste, MD   Social History   Socioeconomic History   Marital status: Legally Separated    Spouse name: Not on file   Number of children: 1   Years of education: College   Highest education level: Not on file  Occupational History   Occupation: Technical brewer: [employer]    Comment: [employer]  Tobacco Use   Smoking status: Never   Smokeless tobacco: Never   Vaping Use   Vaping Use: Never used  Substance and Sexual Activity   Alcohol use: Yes    Alcohol/week: 0.0 - 1.0 standard drinks of alcohol   Drug use: No   Sexual activity: Yes    Partners: Male    Birth control/protection: Surgical  Other Topics Concern   Not on file  Social History Narrative   Daughter lives with her.   Married 1999. Legally separated 2017.   Social Determinants of Health   Financial Resource Strain: Not on file  Food Insecurity: Not on file  Transportation Needs: Not on file  Physical Activity: Not on file  Stress: Not on file  Social Connections: Not on file  Intimate Partner Violence: Not on file    Review of Systems Per HPI.   Objective:   Vitals:   05/16/22 1143  BP: 118/76  Pulse: 81  Temp: 98.6 F (37 C)  TempSrc: Temporal  SpO2: 100%  Weight: 237 lb 9.6 oz (107.8 kg)  Height: '5\' 7"'$  (1.702 m)     Physical Exam Vitals reviewed.  Constitutional:      General: She is not in acute distress.    Appearance: She is well-developed.  HENT:     Head: Normocephalic and atraumatic.     Right Ear: Hearing, tympanic membrane, ear canal and external ear normal.     Left Ear: Hearing,  tympanic membrane, ear canal and external ear normal.     Nose: Congestion present.     Comments: Frontal, maxillary sinuses nontender.    Mouth/Throat:     Pharynx: No posterior oropharyngeal erythema.  Eyes:     Conjunctiva/sclera: Conjunctivae normal.     Pupils: Pupils are equal, round, and reactive to light.  Neck:     Comments: No lymphadenopathy Cardiovascular:     Rate and Rhythm: Normal rate and regular rhythm.     Heart sounds: Normal heart sounds. No murmur heard. Pulmonary:     Effort: Pulmonary effort is normal. No respiratory distress.     Breath sounds: Normal breath sounds. No wheezing or rhonchi.     Comments: Clear Skin:    General: Skin is warm and dry.     Findings: No rash.  Neurological:     Mental Status: She is alert and  oriented to person, place, and time.  Psychiatric:        Mood and Affect: Mood normal.        Behavior: Behavior normal.    Results for orders placed or performed in visit on 05/16/22  POCT Influenza A/B  Result Value Ref Range   Influenza A, POC Negative Negative   Influenza B, POC Negative Negative  POC COVID-19 BinaxNow  Result Value Ref Range   SARS Coronavirus 2 Ag Negative Negative     Assessment & Plan:  [name] is a 52 y.o. female . Congestion of nasal sinus - Plan: POCT Influenza A/B, POC COVID-19 BinaxNow Suspected viral upper respiratory infection.  Doubt bacterial sinusitis given time of symptoms discolored nasal discharge in the mornings only.  Symptomatic care discussed with short-term decongestant, risk of rhinitis medicamentosa if persistent decongestant use, start saline nasal spray, fluids, rest, other symptomatic care with RTC precautions given.  No orders of the defined types were placed in this encounter.  Patient Instructions  I do think you have a viral infection at this time.  Try simply saline or other saline nasal spray over-the-counter multiple times per day to help with nasal congestion, drink plenty of fluids and rest.  Mucinex is also sometimes helpful with mucus production, phlegm.  Decongestant nasal sprays can be helpful short-term, but should not be used more than 3 to 4 days as this can cause rebound or worsening congestion.  If the discolored nasal discharge is not improving as the week goes on, let me know and I can send in an antibiotic, but less likely bacterial sinus infection at this time.  Hope you feel better soon!  Upper Respiratory Infection, Adult An upper respiratory infection (URI) is a common viral infection of the nose, throat, and upper air passages that lead to the lungs. The most common type of URI is the common cold. URIs usually get better on their own, without medical treatment. What are the causes? A URI is caused by a  virus. You may catch a virus by: Breathing in droplets from an infected person's cough or sneeze. Touching something that has been exposed to the virus (is contaminated) and then touching your mouth, nose, or eyes. What increases the risk? You are more likely to get a URI if: You are very young or very old. You have close contact with others, such as at work, school, or a health care facility. You smoke. You have long-term (chronic) heart or lung disease. You have a weakened disease-fighting system (immune system). You have nasal allergies or asthma. You are experiencing  a lot of stress. You have poor nutrition. What are the signs or symptoms? A URI usually involves some of the following symptoms: Runny or stuffy (congested) nose. Cough. Sneezing. Sore throat. Headache. Fatigue. Fever. Loss of appetite. Pain in your forehead, behind your eyes, and over your cheekbones (sinus pain). Muscle aches. Redness or irritation of the eyes. Pressure in the ears or face. How is this diagnosed? This condition may be diagnosed based on your medical history and symptoms, and a physical exam. Your health care provider may use a swab to take a mucus sample from your nose (nasal swab). This sample can be tested to determine what virus is causing the illness. How is this treated? URIs usually get better on their own within 7-10 days. Medicines cannot cure URIs, but your health care provider may recommend certain medicines to help relieve symptoms, such as: Over-the-counter cold medicines. Cough suppressants. Coughing is a type of defense against infection that helps to clear the respiratory system, so take these medicines only as recommended by your health care provider. Fever-reducing medicines. Follow these instructions at home: Activity Rest as needed. If you have a fever, stay home from work or school until your fever is gone or until your health care provider says your URI cannot spread to  other people (is no longer contagious). Your health care provider may have you wear a face mask to prevent your infection from spreading. Relieving symptoms Gargle with a mixture of salt and water 3-4 times a day or as needed. To make salt water, completely dissolve -1 tsp (3-6 g) of salt in 1 cup (237 mL) of warm water. Use a cool-mist humidifier to add moisture to the air. This can help you breathe more easily. Eating and drinking  Drink enough fluid to keep your urine pale yellow. Eat soups and other clear broths. General instructions  Take over-the-counter and prescription medicines only as told by your health care provider. These include cold medicines, fever reducers, and cough suppressants. Do not use any products that contain nicotine or tobacco. These products include cigarettes, chewing tobacco, and vaping devices, such as e-cigarettes. If you need help quitting, ask your health care provider. Stay away from secondhand smoke. Stay up to date on all immunizations, including the yearly (annual) flu vaccine. Keep all follow-up visits. This is important. How to prevent the spread of infection to others URIs can be contagious. To prevent the infection from spreading: Wash your hands with soap and water for at least 20 seconds. If soap and water are not available, use hand sanitizer. Avoid touching your mouth, face, eyes, or nose. Cough or sneeze into a tissue or your sleeve or elbow instead of into your hand or into the air.  Contact a health care provider if: You are getting worse instead of better. You have a fever or chills. Your mucus is brown or red. You have yellow or brown discharge coming from your nose. You have pain in your face, especially when you bend forward. You have swollen neck glands. You have pain while swallowing. You have white areas in the back of your throat. Get help right away if: You have shortness of breath that gets worse. You have severe or  persistent: Headache. Ear pain. Sinus pain. Chest pain. You have chronic lung disease along with any of the following: Making high-pitched whistling sounds when you breathe, most often when you breathe out (wheezing). Prolonged cough (more than 14 days). Coughing up blood. A change in your usual  mucus. You have a stiff neck. You have changes in your: Vision. Hearing. Thinking. Mood. These symptoms may be an emergency. Get help right away. Call 911. Do not wait to see if the symptoms will go away. Do not drive yourself to the hospital. Summary An upper respiratory infection (URI) is a common infection of the nose, throat, and upper air passages that lead to the lungs. A URI is caused by a virus. URIs usually get better on their own within 7-10 days. Medicines cannot cure URIs, but your health care provider may recommend certain medicines to help relieve symptoms. This information is not intended to replace advice given to you by your health care provider. Make sure you discuss any questions you have with your health care provider. Document Revised: 10/07/2020 Document Reviewed: 10/07/2020 Elsevier Patient Education  [place],   Merri Ray, MD [place], [facility] 05/16/22 12:41 PM

## 2022-05-16 NOTE — Patient Instructions (Signed)
I do think you have a viral infection at this time.  Try simply saline or other saline nasal spray over-the-counter multiple times per day to help with nasal congestion, drink plenty of fluids and rest.  Mucinex is also sometimes helpful with mucus production, phlegm.  Decongestant nasal sprays can be helpful short-term, but should not be used more than 3 to 4 days as this can cause rebound or worsening congestion.  If the discolored nasal discharge is not improving as the week goes on, let me know and I can send in an antibiotic, but less likely bacterial sinus infection at this time.  Hope you feel better soon!  Upper Respiratory Infection, Adult An upper respiratory infection (URI) is a common viral infection of the nose, throat, and upper air passages that lead to the lungs. The most common type of URI is the common cold. URIs usually get better on their own, without medical treatment. What are the causes? A URI is caused by a virus. You may catch a virus by: Breathing in droplets from an infected person's cough or sneeze. Touching something that has been exposed to the virus (is contaminated) and then touching your mouth, nose, or eyes. What increases the risk? You are more likely to get a URI if: You are very young or very old. You have close contact with others, such as at work, school, or a health care facility. You smoke. You have long-term (chronic) heart or lung disease. You have a weakened disease-fighting system (immune system). You have nasal allergies or asthma. You are experiencing a lot of stress. You have poor nutrition. What are the signs or symptoms? A URI usually involves some of the following symptoms: Runny or stuffy (congested) nose. Cough. Sneezing. Sore throat. Headache. Fatigue. Fever. Loss of appetite. Pain in your forehead, behind your eyes, and over your cheekbones (sinus pain). Muscle aches. Redness or irritation of the eyes. Pressure in the ears or  face. How is this diagnosed? This condition may be diagnosed based on your medical history and symptoms, and a physical exam. Your health care provider may use a swab to take a mucus sample from your nose (nasal swab). This sample can be tested to determine what virus is causing the illness. How is this treated? URIs usually get better on their own within 7-10 days. Medicines cannot cure URIs, but your health care provider may recommend certain medicines to help relieve symptoms, such as: Over-the-counter cold medicines. Cough suppressants. Coughing is a type of defense against infection that helps to clear the respiratory system, so take these medicines only as recommended by your health care provider. Fever-reducing medicines. Follow these instructions at home: Activity Rest as needed. If you have a fever, stay home from work or school until your fever is gone or until your health care provider says your URI cannot spread to other people (is no longer contagious). Your health care provider may have you wear a face mask to prevent your infection from spreading. Relieving symptoms Gargle with a mixture of salt and water 3-4 times a day or as needed. To make salt water, completely dissolve -1 tsp (3-6 g) of salt in 1 cup (237 mL) of warm water. Use a cool-mist humidifier to add moisture to the air. This can help you breathe more easily. Eating and drinking  Drink enough fluid to keep your urine pale yellow. Eat soups and other clear broths. General instructions  Take over-the-counter and prescription medicines only as told by your health  care provider. These include cold medicines, fever reducers, and cough suppressants. Do not use any products that contain nicotine or tobacco. These products include cigarettes, chewing tobacco, and vaping devices, such as e-cigarettes. If you need help quitting, ask your health care provider. Stay away from secondhand smoke. Stay up to date on all  immunizations, including the yearly (annual) flu vaccine. Keep all follow-up visits. This is important. How to prevent the spread of infection to others URIs can be contagious. To prevent the infection from spreading: Wash your hands with soap and water for at least 20 seconds. If soap and water are not available, use hand sanitizer. Avoid touching your mouth, face, eyes, or nose. Cough or sneeze into a tissue or your sleeve or elbow instead of into your hand or into the air.  Contact a health care provider if: You are getting worse instead of better. You have a fever or chills. Your mucus is brown or red. You have yellow or brown discharge coming from your nose. You have pain in your face, especially when you bend forward. You have swollen neck glands. You have pain while swallowing. You have white areas in the back of your throat. Get help right away if: You have shortness of breath that gets worse. You have severe or persistent: Headache. Ear pain. Sinus pain. Chest pain. You have chronic lung disease along with any of the following: Making high-pitched whistling sounds when you breathe, most often when you breathe out (wheezing). Prolonged cough (more than 14 days). Coughing up blood. A change in your usual mucus. You have a stiff neck. You have changes in your: Vision. Hearing. Thinking. Mood. These symptoms may be an emergency. Get help right away. Call 911. Do not wait to see if the symptoms will go away. Do not drive yourself to the hospital. Summary An upper respiratory infection (URI) is a common infection of the nose, throat, and upper air passages that lead to the lungs. A URI is caused by a virus. URIs usually get better on their own within 7-10 days. Medicines cannot cure URIs, but your health care provider may recommend certain medicines to help relieve symptoms. This information is not intended to replace advice given to you by your health care provider.  Make sure you discuss any questions you have with your health care provider. Document Revised: 10/07/2020 Document Reviewed: 10/07/2020 Elsevier Patient Education  [place].

## 2022-08-14 IMAGING — MG DIGITAL SCREENING BILAT W/ CAD
5 series · 5 of 5 positions shown · non-contrast
Comparison: Previous exam(s).

CLINICAL DATA: Screening.

EXAM:
DIGITAL SCREENING BILATERAL MAMMOGRAM WITH CAD

[R MLO (1 of 2)]
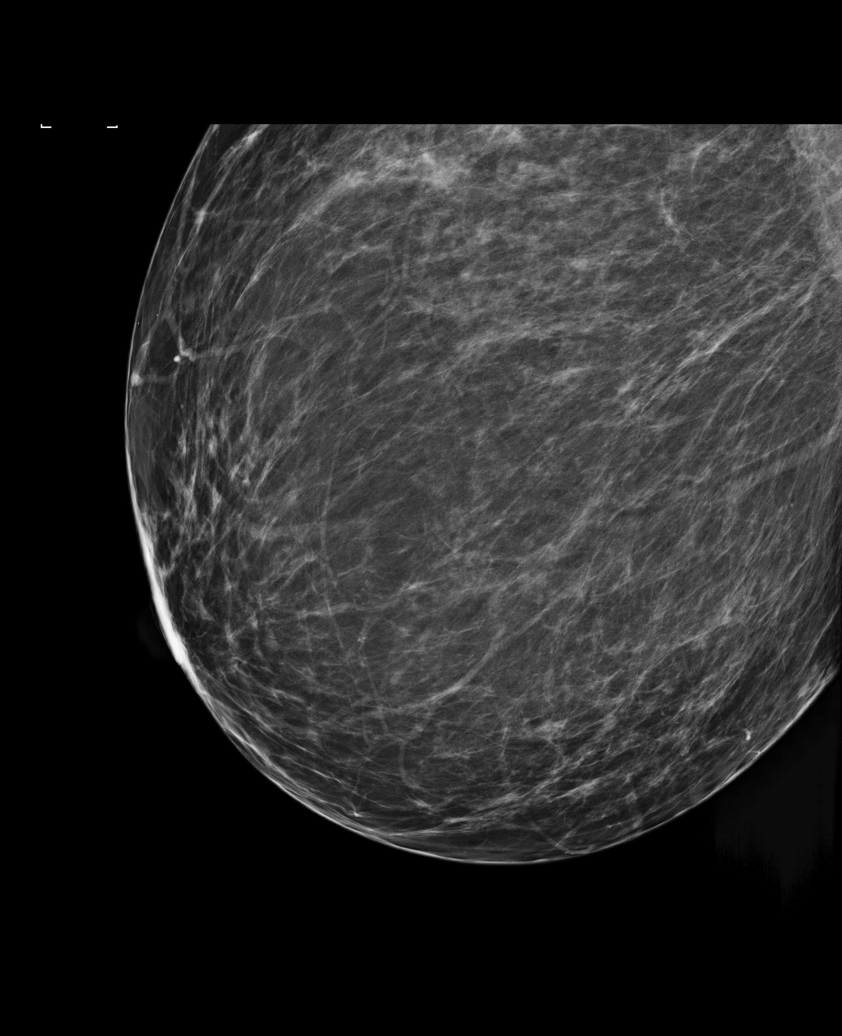

[R CC]
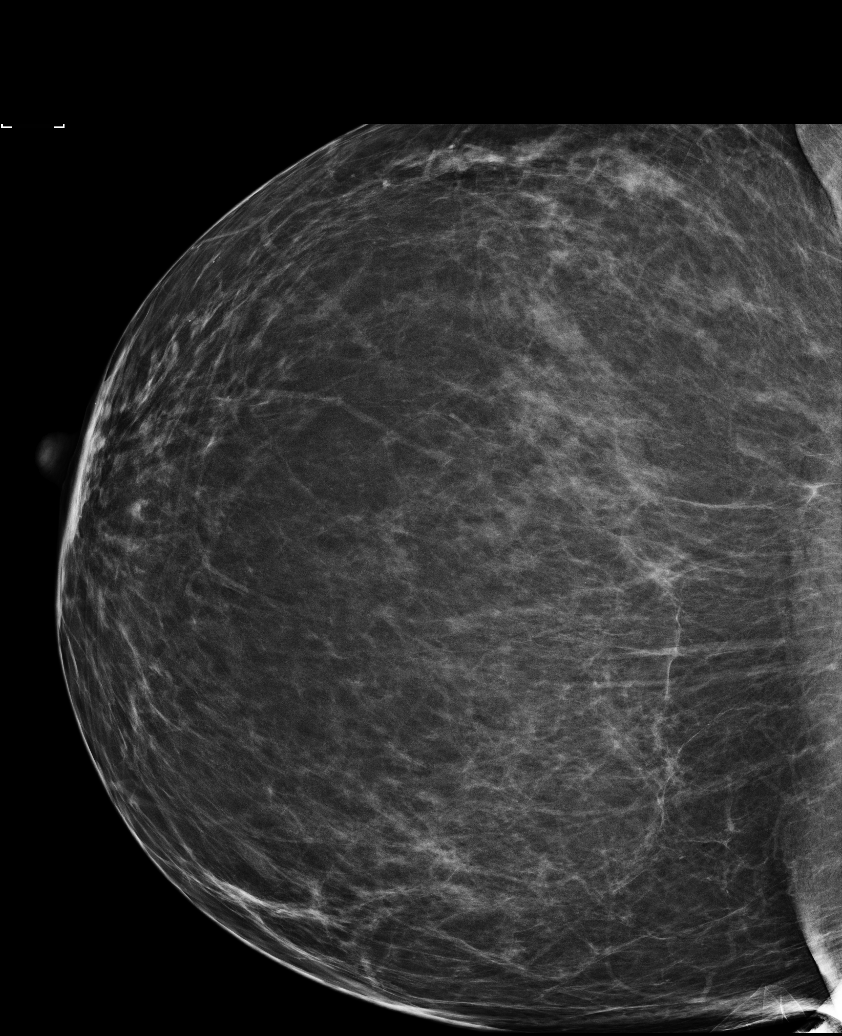

[L MLO]
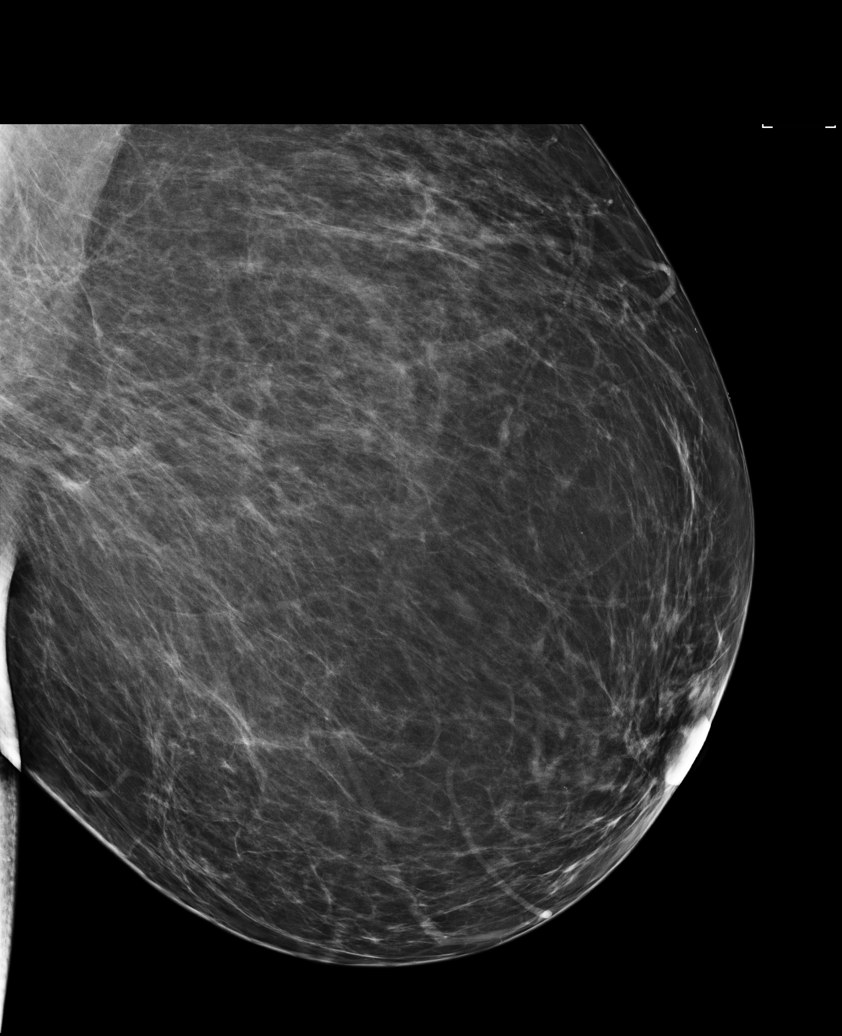

[L CC]
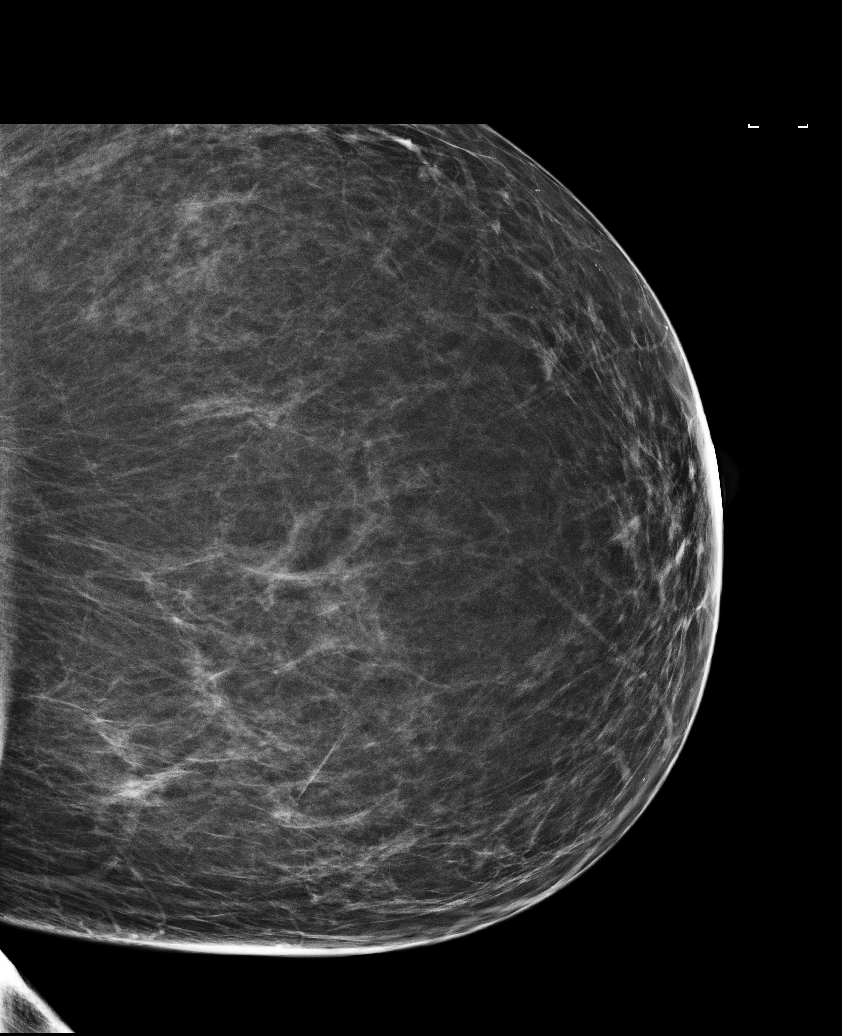

[R MLO (2 of 2)]
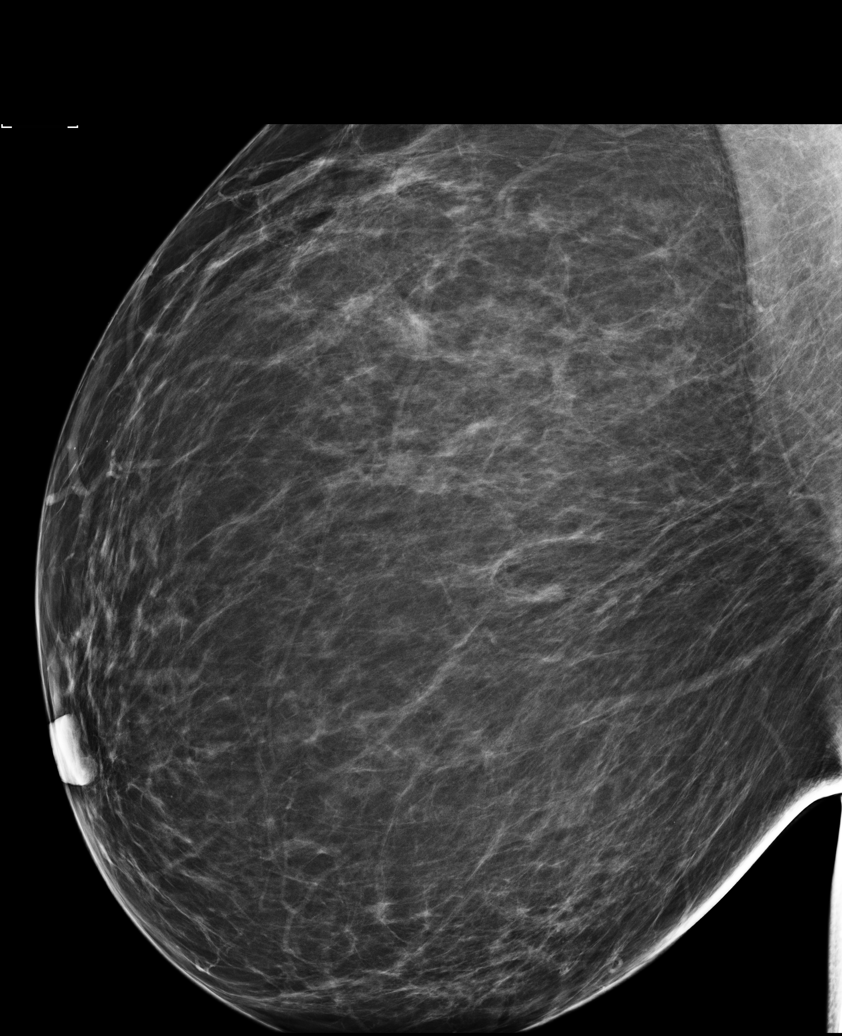

[5 of 5 positions shown; findings below may reference images not displayed]

ACR Breast Density Category b: There are scattered areas of
fibroglandular density.
FINDINGS: There are no findings suspicious for malignancy. Images were
processed with CAD.
IMPRESSION: No mammographic evidence of malignancy. A result letter of this
screening mammogram will be mailed directly to the patient.

RECOMMENDATION:
Screening mammogram in one year. (Code:AS-G-LCT)

BI-RADS CATEGORY  1: Negative.

## 2022-12-22 ENCOUNTER — Other Ambulatory Visit: Payer: Self-pay | Admitting: Obstetrics and Gynecology

## 2022-12-22 DIAGNOSIS — Z0000 Encounter for general adult medical examination without abnormal findings: Secondary | ICD-10-CM

## 2023-01-10 ENCOUNTER — Ambulatory Visit
Admission: RE | Admit: 2023-01-10 | Discharge: 2023-01-10 | Disposition: A | Discharge status: Home / Self Care | Payer: BC Managed Care – PPO | Source: Ambulatory Visit | Attending: Obstetrics and Gynecology | Admitting: Obstetrics and Gynecology

## 2023-01-10 DIAGNOSIS — Z0000 Encounter for general adult medical examination without abnormal findings: Secondary | ICD-10-CM

## 2023-01-27 ENCOUNTER — Encounter: Payer: Self-pay | Admitting: Family Medicine

## 2023-01-27 ENCOUNTER — Ambulatory Visit: Payer: BC Managed Care – PPO | Admitting: Family Medicine

## 2023-01-27 VITALS — BP 130/70 | HR 98 | Temp 97.6°F | Ht 67.0 in | Wt 242.4 lb

## 2023-01-27 DIAGNOSIS — J3489 Other specified disorders of nose and nasal sinuses: Secondary | ICD-10-CM

## 2023-01-27 DIAGNOSIS — J069 Acute upper respiratory infection, unspecified: Secondary | ICD-10-CM | POA: Diagnosis not present

## 2023-01-27 LAB — POC COVID19 BINAXNOW: SARS Coronavirus 2 Ag: NEGATIVE

## 2023-01-27 LAB — POCT INFLUENZA A/B
Influenza A, POC: NEGATIVE
Influenza B, POC: NEGATIVE

## 2023-01-27 MED ORDER — AMOXICILLIN-POT CLAVULANATE 875-125 MG PO TABS: 1.0000 | ORAL_TABLET | Freq: Two times a day (BID) | ORAL | 0 refills | Status: DC

## 2023-01-27 NOTE — Progress Notes (Signed)
Subjective:  Patient ID: Jessica Mccarthy, female    DOB: 02/22/1971  Age: 52 y.o. MRN: [id number]  CC:  Chief Complaint  Patient presents with   Sinus Problem    Pt notes headache, sinus pressure, sore throat, no testing done at this time, pt reports started Wednesday     HPI Jessica Mccarthy presents for   Sinus congestion As above, reports sinus headache, pressure, sore throat - started 2 days ago. Scratchy throat, then HA around and congestion in nose. Initially clear, now yellow-green. No sick contacts. Sleeping ok. No shortness of breath, new confusion or chest pain.   Trip next Saturday.   Tx: alka seltzer cold - unknown relief.    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 Authorizing Provider  clotrimazole (LOTRIMIN) 1 % cream Apply 1 Application topically 2 (two) times daily. 03/30/22  Yes Shade Flood, MD  nystatin powder Apply topically 3 (three) times daily. 03/30/22  Yes Shade Flood, MD  Lidocaine, Anorectal, 5 % GEL Apply pea-sized amount to the perianal skin up to 3 times per day as needed before bowel movement. Patient not taking: Reported on 03/30/2022 02/11/19   Shade Flood, MD   Social History   Socioeconomic History   Marital status: Legally Separated    Spouse name: Not on file   Number of children: 1   Years of education: College   Highest education level: Not on file  Occupational History   Occupation: Sales promotion account executive: [employer]    Comment: [employer]  Tobacco Use   Smoking status: Never   Smokeless tobacco: Never  Vaping Use   Vaping status: Never Used  Substance  and Sexual Activity   Alcohol use: Yes    Alcohol/week: 0.0 - 1.0 standard drinks of alcohol   Drug use: No   Sexual activity: Yes    Partners: Male    Birth control/protection: Surgical  Other Topics Concern   Not on file  Social History Narrative   Daughter lives with her.   Married 1999. Legally separated 2017.   Social Determinants of Health   Financial Resource Strain: Not on file  Food Insecurity: Not on file  Transportation Needs: Not on file  Physical Activity: Not on file  Stress: Not on file  Social Connections: Unknown (07/23/2021)   Received from [facility]   Social Network    Social Network: Not on file  Intimate Partner Violence: Unknown (07/23/2021)   Received from [facility], [facility]   HITS    Physically Hurt: Not on file    Insult or Talk Down To: Not on file    Threaten Physical Harm: Not on file    Scream or Curse: Not on file    Review of Systems  Per HPI.  Objective:   Vitals:   01/27/23 0902  BP: 130/70  Pulse: 98  Temp: 97.6 F (36.4 C)  TempSrc: Temporal  SpO2: 98%  Weight: 242 lb 6.4 oz (110 kg)  Height: 5\' 7"  (1.702 m)     Physical Exam Vitals reviewed.  Constitutional:  General: She is not in acute distress.    Appearance: She is well-developed.  HENT:     Head: Normocephalic and atraumatic.     Right Ear: Hearing, tympanic membrane, ear canal and external ear normal.     Left Ear: Hearing, tympanic membrane, ear canal and external ear normal.     Nose: Nose normal.     Comments: Minimal pressure on maxillary sinuses, not painful with percussion.    Mouth/Throat:     Pharynx: No posterior oropharyngeal erythema.  Eyes:     Conjunctiva/sclera: Conjunctivae normal.     Pupils: Pupils are equal, round, and reactive to light.  Cardiovascular:     Rate and Rhythm: Normal rate and regular rhythm.     Heart sounds: Normal heart sounds. No murmur heard. Pulmonary:     Effort: Pulmonary effort is  normal. No respiratory distress.     Breath sounds: Normal breath sounds. No wheezing or rhonchi.  Skin:    General: Skin is warm and dry.     Findings: No rash.  Neurological:     Mental Status: She is alert and oriented to person, place, and time.  Psychiatric:        Mood and Affect: Mood normal.        Behavior: Behavior normal.      Results for orders placed or performed in visit on 01/27/23  POC COVID-19  Result Value Ref Range   SARS Coronavirus 2 Ag Negative Negative  POCT Influenza A/B  Result Value Ref Range   Influenza A, POC Negative Negative   Influenza B, POC Negative Negative    Assessment & Plan:  Jessica Mccarthy is a 52 y.o. female . Sinus pressure - Plan: POC COVID-19, POCT Influenza A/B  Upper respiratory tract infection, unspecified type - Plan: amoxicillin-clavulanate (AUGMENTIN) 875-125 MG tablet Likely viral illness, negative COVID, flu testing as above, repeat COVID testing at home in the next 24 to 48 hours.  At that time, unlikely COVID infection.  Masking precautions given if around others temporarily, symptomatic care with RTC precautions given.  Augmentin prescribed if not improving, specifically with discolored nasal discharge and sinus pressure in the next 5 days to treat for sinus infection but potential side effects and risks were discussed.  RTC precautions given.  Meds ordered this encounter  Medications   amoxicillin-clavulanate (AUGMENTIN) 875-125 MG tablet    Sig: Take 1 tablet by mouth 2 (two) times daily.    Dispense:  20 tablet    Refill:  0   Patient Instructions  Tests in office ok. You likely have a virus at this point. You can repeat covid test in the next 24-48 hours and if negative, unlikely covid infection.  Saline nasal spray and drinking fluids may help, alka seltzer is fine for now.  If not improving in the next 5 days, did prescribe Augmentin antibiotic for possible sinus infection.  Hope you are better soon.  Return to the  clinic or go to the nearest emergency room if any of your symptoms worsen or new symptoms occur.   Upper Respiratory Infection, Adult An upper respiratory infection (URI) is a common viral infection of the nose, throat, and upper air passages that lead to the lungs. The most common type of URI is the common cold. URIs usually get better on their own, without medical treatment. What are the causes? A URI is caused by a virus. You may catch a virus by: Breathing in droplets from an infected person's  cough or sneeze. Touching something that has been exposed to the virus (is contaminated) and then touching your mouth, nose, or eyes. What increases the risk? You are more likely to get a URI if: You are very young or very old. You have close contact with others, such as at work, school, or a health care facility. You smoke. You have long-term (chronic) heart or lung disease. You have a weakened disease-fighting system (immune system). You have nasal allergies or asthma. You are experiencing a lot of stress. You have poor nutrition. What are the signs or symptoms? A URI usually involves some of the following symptoms: Runny or stuffy (congested) nose. Cough. Sneezing. Sore throat. Headache. Fatigue. Fever. Loss of appetite. Pain in your forehead, behind your eyes, and over your cheekbones (sinus pain). Muscle aches. Redness or irritation of the eyes. Pressure in the ears or face. How is this diagnosed? This condition may be diagnosed based on your medical history and symptoms, and a physical exam. Your health care provider may use a swab to take a mucus sample from your nose (nasal swab). This sample can be tested to determine what virus is causing the illness. How is this treated? URIs usually get better on their own within 7-10 days. Medicines cannot cure URIs, but your health care provider may recommend certain medicines to help relieve symptoms, such as: Over-the-counter cold  medicines. Cough suppressants. Coughing is a type of defense against infection that helps to clear the respiratory system, so take these medicines only as recommended by your health care provider. Fever-reducing medicines. Follow these instructions at home: Activity Rest as needed. If you have a fever, stay home from work or school until your fever is gone or until your health care provider says your URI cannot spread to other people (is no longer contagious). Your health care provider may have you wear a face mask to prevent your infection from spreading. Relieving symptoms Gargle with a mixture of salt and water 3-4 times a day or as needed. To make salt water, completely dissolve -1 tsp (3-6 g) of salt in 1 cup (237 mL) of warm water. Use a cool-mist humidifier to add moisture to the air. This can help you breathe more easily. Eating and drinking  Drink enough fluid to keep your urine pale yellow. Eat soups and other clear broths. General instructions  Take over-the-counter and prescription medicines only as told by your health care provider. These include cold medicines, fever reducers, and cough suppressants. Do not use any products that contain nicotine or tobacco. These products include cigarettes, chewing tobacco, and vaping devices, such as e-cigarettes. If you need help quitting, ask your health care provider. Stay away from secondhand smoke. Stay up to date on all immunizations, including the yearly (annual) flu vaccine. Keep all follow-up visits. This is important. How to prevent the spread of infection to others URIs can be contagious. To prevent the infection from spreading: Wash your hands with soap and water for at least 20 seconds. If soap and water are not available, use hand sanitizer. Avoid touching your mouth, face, eyes, or nose. Cough or sneeze into a tissue or your sleeve or elbow instead of into your hand or into the air.  Contact a health care provider if: You  are getting worse instead of better. You have a fever or chills. Your mucus is brown or red. You have yellow or brown discharge coming from your nose. You have pain in your face, especially when you bend  forward. You have swollen neck glands. You have pain while swallowing. You have white areas in the back of your throat. Get help right away if: You have shortness of breath that gets worse. You have severe or persistent: Headache. Ear pain. Sinus pain. Chest pain. You have chronic lung disease along with any of the following: Making high-pitched whistling sounds when you breathe, most often when you breathe out (wheezing). Prolonged cough (more than 14 days). Coughing up blood. A change in your usual mucus. You have a stiff neck. You have changes in your: Vision. Hearing. Thinking. Mood. These symptoms may be an emergency. Get help right away. Call 911. Do not wait to see if the symptoms will go away. Do not drive yourself to the hospital. Summary An upper respiratory infection (URI) is a common infection of the nose, throat, and upper air passages that lead to the lungs. A URI is caused by a virus. URIs usually get better on their own within 7-10 days. Medicines cannot cure URIs, but your health care provider may recommend certain medicines to help relieve symptoms. This information is not intended to replace advice given to you by your health care provider. Make sure you discuss any questions you have with your health care provider. Document Revised: 10/07/2020 Document Reviewed: 10/07/2020 Elsevier Patient Education  2024 [employer]        Signed,   Meredith Staggers, MD [facility], [facility] 01/27/23 9:44 AM

## 2023-01-27 NOTE — Patient Instructions (Addendum)
Tests in office ok. You likely have a virus at this point. You can repeat covid test in the next 24-48 hours and if negative, unlikely covid infection.  Saline nasal spray and drinking fluids may help, alka seltzer is fine for now.  If not improving in the next 5 days, did prescribe Augmentin antibiotic for possible sinus infection.  Hope you are better soon.  Return to the clinic or go to the nearest emergency room if any of your symptoms worsen or new symptoms occur.   Upper Respiratory Infection, Adult An upper respiratory infection (URI) is a common viral infection of the nose, throat, and upper air passages that lead to the lungs. The most common type of URI is the common cold. URIs usually get better on their own, without medical treatment. What are the causes? A URI is caused by a virus. You may catch a virus by: Breathing in droplets from an infected person's cough or sneeze. Touching something that has been exposed to the virus (is contaminated) and then touching your mouth, nose, or eyes. What increases the risk? You are more likely to get a URI if: You are very young or very old. You have close contact with others, such as at work, school, or a health care facility. You smoke. You have long-term (chronic) heart or lung disease. You have a weakened disease-fighting system (immune system). You have nasal allergies or asthma. You are experiencing a lot of stress. You have poor nutrition. What are the signs or symptoms? A URI usually involves some of the following symptoms: Runny or stuffy (congested) nose. Cough. Sneezing. Sore throat. Headache. Fatigue. Fever. Loss of appetite. Pain in your forehead, behind your eyes, and over your cheekbones (sinus pain). Muscle aches. Redness or irritation of the eyes. Pressure in the ears or face. How is this diagnosed? This condition may be diagnosed based on your medical history and symptoms, and a physical exam. Your health care  provider may use a swab to take a mucus sample from your nose (nasal swab). This sample can be tested to determine what virus is causing the illness. How is this treated? URIs usually get better on their own within 7-10 days. Medicines cannot cure URIs, but your health care provider may recommend certain medicines to help relieve symptoms, such as: Over-the-counter cold medicines. Cough suppressants. Coughing is a type of defense against infection that helps to clear the respiratory system, so take these medicines only as recommended by your health care provider. Fever-reducing medicines. Follow these instructions at home: Activity Rest as needed. If you have a fever, stay home from work or school until your fever is gone or until your health care provider says your URI cannot spread to other people (is no longer contagious). Your health care provider may have you wear a face mask to prevent your infection from spreading. Relieving symptoms Gargle with a mixture of salt and water 3-4 times a day or as needed. To make salt water, completely dissolve -1 tsp (3-6 g) of salt in 1 cup (237 mL) of warm water. Use a cool-mist humidifier to add moisture to the air. This can help you breathe more easily. Eating and drinking  Drink enough fluid to keep your urine pale yellow. Eat soups and other clear broths. General instructions  Take over-the-counter and prescription medicines only as told by your health care provider. These include cold medicines, fever reducers, and cough suppressants. Do not use any products that contain nicotine or tobacco. These products  include cigarettes, chewing tobacco, and vaping devices, such as e-cigarettes. If you need help quitting, ask your health care provider. Stay away from secondhand smoke. Stay up to date on all immunizations, including the yearly (annual) flu vaccine. Keep all follow-up visits. This is important. How to prevent the spread of infection to  others URIs can be contagious. To prevent the infection from spreading: Wash your hands with soap and water for at least 20 seconds. If soap and water are not available, use hand sanitizer. Avoid touching your mouth, face, eyes, or nose. Cough or sneeze into a tissue or your sleeve or elbow instead of into your hand or into the air.  Contact a health care provider if: You are getting worse instead of better. You have a fever or chills. Your mucus is brown or red. You have yellow or brown discharge coming from your nose. You have pain in your face, especially when you bend forward. You have swollen neck glands. You have pain while swallowing. You have white areas in the back of your throat. Get help right away if: You have shortness of breath that gets worse. You have severe or persistent: Headache. Ear pain. Sinus pain. Chest pain. You have chronic lung disease along with any of the following: Making high-pitched whistling sounds when you breathe, most often when you breathe out (wheezing). Prolonged cough (more than 14 days). Coughing up blood. A change in your usual mucus. You have a stiff neck. You have changes in your: Vision. Hearing. Thinking. Mood. These symptoms may be an emergency. Get help right away. Call 911. Do not wait to see if the symptoms will go away. Do not drive yourself to the hospital. Summary An upper respiratory infection (URI) is a common infection of the nose, throat, and upper air passages that lead to the lungs. A URI is caused by a virus. URIs usually get better on their own within 7-10 days. Medicines cannot cure URIs, but your health care provider may recommend certain medicines to help relieve symptoms. This information is not intended to replace advice given to you by your health care provider. Make sure you discuss any questions you have with your health care provider. Document Revised: 10/07/2020 Document Reviewed: 10/07/2020 Elsevier  Patient Education  2024 [employer].

## 2023-04-05 ENCOUNTER — Encounter: Payer: BC Managed Care – PPO | Admitting: Family Medicine

## 2023-04-12 ENCOUNTER — Encounter: Payer: Self-pay | Admitting: Family Medicine

## 2023-04-12 ENCOUNTER — Ambulatory Visit (INDEPENDENT_AMBULATORY_CARE_PROVIDER_SITE_OTHER): Payer: 59 | Admitting: Family Medicine

## 2023-04-12 VITALS — BP 128/68 | HR 84 | Temp 98.0°F | Ht 67.25 in | Wt 243.8 lb

## 2023-04-12 DIAGNOSIS — Z0000 Encounter for general adult medical examination without abnormal findings: Secondary | ICD-10-CM

## 2023-04-12 DIAGNOSIS — Z23 Encounter for immunization: Secondary | ICD-10-CM

## 2023-04-12 DIAGNOSIS — Z13220 Encounter for screening for lipoid disorders: Secondary | ICD-10-CM

## 2023-04-12 DIAGNOSIS — Z1329 Encounter for screening for other suspected endocrine disorder: Secondary | ICD-10-CM | POA: Diagnosis not present

## 2023-04-12 DIAGNOSIS — Z131 Encounter for screening for diabetes mellitus: Secondary | ICD-10-CM

## 2023-04-12 DIAGNOSIS — Z1159 Encounter for screening for other viral diseases: Secondary | ICD-10-CM

## 2023-04-12 DIAGNOSIS — Z130 Encounter for screening for diseases of the blood and blood-forming organs and certain disorders involving the immune mechanism: Secondary | ICD-10-CM | POA: Diagnosis not present

## 2023-04-12 LAB — CBC WITH DIFFERENTIAL/PLATELET
Basophils Absolute: 0 10*3/uL (ref 0.0–0.1)
Basophils Relative: 0.6 % (ref 0.0–3.0)
Eosinophils Absolute: 0.1 10*3/uL (ref 0.0–0.7)
Eosinophils Relative: 1.4 % (ref 0.0–5.0)
HCT: 44.2 % (ref 36.0–46.0)
Hemoglobin: 14.8 g/dL (ref 12.0–15.0)
Lymphocytes Relative: 34.6 % (ref 12.0–46.0)
Lymphs Abs: 1.5 10*3/uL (ref 0.7–4.0)
MCHC: 33.5 g/dL (ref 30.0–36.0)
MCV: 85 fL (ref 78.0–100.0)
Monocytes Absolute: 0.3 10*3/uL (ref 0.1–1.0)
Monocytes Relative: 6.7 % (ref 3.0–12.0)
Neutro Abs: 2.4 10*3/uL (ref 1.4–7.7)
Neutrophils Relative %: 56.7 % (ref 43.0–77.0)
Platelets: 402 10*3/uL — ABNORMAL HIGH (ref 150.0–400.0)
RBC: 5.19 Mil/uL — ABNORMAL HIGH (ref 3.87–5.11)
RDW: 13.3 % (ref 11.5–15.5)
WBC: 4.3 10*3/uL (ref 4.0–10.5)

## 2023-04-12 LAB — COMPREHENSIVE METABOLIC PANEL
ALT: 25 U/L (ref 0–35)
AST: 23 U/L (ref 0–37)
Albumin: 4.5 g/dL (ref 3.5–5.2)
Alkaline Phosphatase: 73 U/L (ref 39–117)
BUN: 9 mg/dL (ref 6–23)
CO2: 30 meq/L (ref 19–32)
Calcium: 9.6 mg/dL (ref 8.4–10.5)
Chloride: 101 meq/L (ref 96–112)
Creatinine, Ser: 0.8 mg/dL (ref 0.40–1.20)
GFR: 85.68 mL/min (ref >60.00)
Glucose, Bld: 86 mg/dL (ref 70–99)
Potassium: 4.7 meq/L (ref 3.5–5.1)
Sodium: 137 meq/L (ref 135–145)
Total Bilirubin: 0.4 mg/dL (ref 0.2–1.2)
Total Protein: 7.9 g/dL (ref 6.0–8.3)

## 2023-04-12 LAB — LIPID PANEL
Cholesterol: 218 mg/dL — ABNORMAL HIGH (ref 0–200)
HDL: 69 mg/dL (ref >39.00)
LDL Cholesterol: 132 mg/dL — ABNORMAL HIGH (ref 0–99)
NonHDL: 149.27
Total CHOL/HDL Ratio: 3
Triglycerides: 84 mg/dL (ref 0.0–149.0)
VLDL: 16.8 mg/dL (ref 0.0–40.0)

## 2023-04-12 LAB — HEMOGLOBIN A1C: Hgb A1c MFr Bld: 5.5 % (ref 4.6–6.5)

## 2023-04-12 LAB — TSH: TSH: 2.56 u[IU]/mL (ref 0.35–5.50)

## 2023-04-12 NOTE — Progress Notes (Signed)
Subjective:  Patient ID: Jessica Mccarthy, female    DOB: [birth year]  Age: 53 y.o. MRN: [id number]  CC:  Chief Complaint  Patient presents with   Annual Exam    Pt is doing well, notes she is not fasting had some juice this morning     HPI Jessica Mccarthy presents for Annual Exam  No specific concerns or questions today.  No health changes since last visit.  Traveled monthly last year - including [place]. Doing well.  Last physical in January 2024.  We discussed nystatin powder, antifungal cream or over-the-counter treatments for suspected candidal infections under her breasts.no meds needed at this time.  Occasional soreness in knees, not limiting function.    Obesity Briefly discussed at her physical last year, she was consider meeting with weight loss specialist at that time. Did not meet with specialist. Plans to meet with specialist this year. Weight watchers in past.  Declines surgical treatment. Weight 235 last year.  Body mass index is 37.9 kg/m. Wt Readings from Last 3 Encounters:  04/12/23 243 lb 12.8 oz (110.6 kg)  01/27/23 242 lb 6.4 oz (110 kg)  05/16/22 237 lb 9.6 oz (107.8 kg)       04/12/2023   10:13 AM 01/27/2023    9:02 AM 05/16/2022   11:40 AM 03/30/2022    9:28 AM 07/26/2021   11:54 AM  Depression screen PHQ 2/9  Decreased Interest 0 0 0 1 0  Down, Depressed, Hopeless 0 0 0 0 0  PHQ - 2 Score 0 0 0 1 0  Altered sleeping 0 0 0 0   Tired, decreased energy 0 0 0 1   Change in appetite 0 0 0 0   Feeling bad or failure about yourself  0 0 0 0   Trouble concentrating 0 0 0 0   Moving slowly or fidgety/restless 0 0 0 0   Suicidal thoughts 0 0 0 0   PHQ-9 Score 0 0 0 2     Health Maintenance  Topic Date Due   Hepatitis C Screening  Never done   Colonoscopy  Never done   Zoster Vaccines- Shingrix (1 of 2) Never done   INFLUENZA VACCINE  10/20/2022   COVID-19 Vaccine (2 - 2024-25 season) 11/20/2022   MAMMOGRAM  01/09/2025   DTaP/Tdap/Td (2 - Td or Tdap)  03/07/2029   HIV Screening  Completed   HPV VACCINES  Aged Out  Reports colonoscopy approximately 3 years ago with [facility] and plan repeat 10 years. Mammogram 01/10/23.  GYN - Gerald Leitz - pap testing UTD.   Immunization History  Administered Date(s) Administered   Influenza,inj,Quad PF,6+ Mos 03/30/2022   Influenza-Unspecified 12/20/2015   Janssen (J&J) SARS-COV-2 Vaccination 06/28/2019   Tdap 03/08/2019  Flu vaccine - today.  Covid booster - declines.  Shingles vaccine - today - potential side effects discussed. Pneumonia vaccine - defers for now.   No results found. Wears glasses. Optho visit 2023. Due this year. No vision changes.   Dental:Within Last 6 months   Alcohol: 1 per week.   Tobacco: none  Exercise: no regular exercise at this time - FITT principle discussed.    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 Authorizing Provider  clotrimazole (LOTRIMIN) 1 % cream Apply 1 Application topically 2 (two) times daily. 03/30/22  Yes Shade Flood, MD  nystatin powder Apply topically 3 (three) times daily. 03/30/22  Yes Shade Flood, MD   Social History   Socioeconomic History   Marital status: Legally Separated    Spouse name: Not on file   Number of children: 1   Years of education: College   Highest education level: Not on file  Occupational History   Occupation: Sales promotion account executive: [employer]    Comment: Prov[employer]'s Office  Tobacco Use   Smoking status: Never   Smokeless tobacco: Never  Vaping Use   Vaping status: Never Used  Substance and Sexual Activity   Alcohol use: Yes     Alcohol/week: 0.0 - 1.0 standard drinks of alcohol   Drug use: No   Sexual activity: Yes    Partners: Male    Birth control/protection: Surgical  Other Topics Concern   Not on file  Social History Narrative   Daughter lives with her.   Married 1999. Legally separated 2017.   Social Drivers of Corporate investment banker Strain: Not on file  Food Insecurity: Not on file  Transportation Needs: Not on file  Physical Activity: Not on file  Stress: Not on file  Social Connections: Unknown (07/23/2021)   Received from [facility]   Social Network    Social Network: Not on file  Intimate Partner Violence: Unknown (07/23/2021)   Received from [facility]   HITS    Physically Hurt: Not on file    Insult or Talk Down To: Not on file    Threaten Physical Harm: Not on file    Scream or Curse: Not on file    Review of Systems Per hpi 13 point review of systems per patient health survey noted.  Negative other than as indicated above or in HPI.    Objective:   Vitals:   04/12/23 1017  BP: 128/68  Pulse: 84  Temp: 98 F (36.7 C)  TempSrc: Temporal  SpO2: 97%  Weight: 243 lb 12.8 oz (110.6 kg)  Height: 5' 7.25" (1.708 m)     Physical Exam Constitutional:      Appearance: She is well-developed.  HENT:     Head: Normocephalic and atraumatic.     Right Ear: External ear normal.     Left Ear: External ear normal.  Eyes:     Conjunctiva/sclera: Conjunctivae normal.     Pupils: Pupils are equal, round, and reactive to light.  Neck:     Thyroid: No thyromegaly.  Cardiovascular:     Rate and Rhythm: Normal rate and regular rhythm.     Heart sounds: Normal heart sounds. No murmur heard. Pulmonary:     Effort: Pulmonary effort is normal. No respiratory distress.     Breath sounds: Normal breath sounds. No wheezing.  Abdominal:     General: Bowel sounds are normal.     Palpations: Abdomen is soft.     Tenderness: There is no abdominal  tenderness.  Musculoskeletal:        General: No tenderness. Normal range of motion.     Cervical back: Normal range of motion and neck supple.  Lymphadenopathy:     Cervical: No cervical adenopathy.  Skin:    General: Skin is warm and dry.  Findings: No rash.  Neurological:     Mental Status: She is alert and oriented to person, place, and time.  Psychiatric:        Behavior: Behavior normal.        Thought Content: Thought content normal.        Assessment & Plan:  Sparkle Aube is a 53 y.o. female . Annual physical exam - Plan: CBC with Differential/Platelet, Comprehensive metabolic panel, Lipid panel, TSH, Hemoglobin A1c  - -anticipatory guidance as below in AVS, screening labs above. Health maintenance items as above in HPI discussed/recommended as applicable.   - declines surgical weight loss options as this time. Phone number provided for medical weight loss.   Screening for diabetes mellitus - Plan: Comprehensive metabolic panel  Screening for hyperlipidemia - Plan: Comprehensive metabolic panel, Lipid panel, Hemoglobin A1c  Screening for thyroid disorder - Plan: Comprehensive metabolic panel, TSH  Screening for deficiency anemia - Plan: CBC with Differential/Platelet, Comprehensive metabolic panel  Flu vaccine need - Plan: Flu vaccine trivalent PF, 6mos and older(Flulaval,Afluria,Fluarix,Fluzone)  Need for hepatitis C screening test - Plan: Hepatitis C Antibody  Need for shingles vaccine - Plan: Varicella-zoster vaccine IM   No orders of the defined types were placed in this encounter.  Patient Instructions  [facility]  [phone]  If any concerns on labs I will let you know.  Phone number above is an option if needed for weight loss but let me know if I can help further.  Flu vaccine and first shingles vaccine given today.  Repeat shingles vaccine between 2 and 6 months at a nurse visit is fine.  Pneumonia  vaccine can be given anytime.  We can perform that here or you can have that at your pharmacy.  Take care.    Preventive Care 31-33 Years Old, Female Preventive care refers to lifestyle choices and visits with your health care provider that can promote health and wellness. Preventive care visits are also called wellness exams. What can I expect for my preventive care visit? Counseling Your health care provider may ask you questions about your: Medical history, including: Past medical problems. Family medical history. Pregnancy history. Current health, including: Menstrual cycle. Method of birth control. Emotional well-being. Home life and relationship well-being. Sexual activity and sexual health. Lifestyle, including: Alcohol, nicotine or tobacco, and drug use. Access to firearms. Diet, exercise, and sleep habits. Work and work Astronomer. Sunscreen use. Safety issues such as seatbelt and bike helmet use. Physical exam Your health care provider will check your: Height and weight. These may be used to calculate your BMI (body mass index). BMI is a measurement that tells if you are at a healthy weight. Waist circumference. This measures the distance around your waistline. This measurement also tells if you are at a healthy weight and may help predict your risk of certain diseases, such as type 2 diabetes and high blood pressure. Heart rate and blood pressure. Body temperature. Skin for abnormal spots. What immunizations do I need?  Vaccines are usually given at various ages, according to a schedule. Your health care provider will recommend vaccines for you based on your age, medical history, and lifestyle or other factors, such as travel or where you work. What tests do I need? Screening Your health care provider may recommend screening tests for certain conditions. This may include: Lipid and cholesterol levels. Diabetes screening. This is done by checking your blood sugar  (glucose) after you have not eaten  for a while (fasting). Pelvic exam and Pap test. Hepatitis B test. Hepatitis C test. HIV (human immunodeficiency virus) test. STI (sexually transmitted infection) testing, if you are at risk. Lung cancer screening. Colorectal cancer screening. Mammogram. Talk with your health care provider about when you should start having regular mammograms. This may depend on whether you have a family history of breast cancer. BRCA-related cancer screening. This may be done if you have a family history of breast, ovarian, tubal, or peritoneal cancers. Bone density scan. This is done to screen for osteoporosis. Talk with your health care provider about your test results, treatment options, and if necessary, the need for more tests. Follow these instructions at home: Eating and drinking  Eat a diet that includes fresh fruits and vegetables, whole grains, lean protein, and low-fat dairy products. Take vitamin and mineral supplements as recommended by your health care provider. Do not drink alcohol if: Your health care provider tells you not to drink. You are pregnant, may be pregnant, or are planning to become pregnant. If you drink alcohol: Limit how much you have to 0-1 drink a day. Know how much alcohol is in your drink. In the [place] one drink equals one 12 oz bottle of beer (355 mL), one 5 oz glass of wine (148 mL), or one 1 oz glass of hard liquor (44 mL). Lifestyle Brush your teeth every morning and night with fluoride toothpaste. Floss one time each day. Exercise for at least 30 minutes 5 or more days each week. Do not use any products that contain nicotine or tobacco. These products include cigarettes, chewing tobacco, and vaping devices, such as e-cigarettes. If you need help quitting, ask your health care provider. Do not use drugs. If you are sexually active, practice safe sex. Use a condom or other form of protection to prevent STIs. If you do not wish to  become pregnant, use a form of birth control. If you plan to become pregnant, see your health care provider for a prepregnancy visit. Take aspirin only as told by your health care provider. Make sure that you understand how much to take and what form to take. Work with your health care provider to find out whether it is safe and beneficial for you to take aspirin daily. Find healthy ways to manage stress, such as: Meditation, yoga, or listening to music. Journaling. Talking to a trusted person. Spending time with friends and family. Minimize exposure to UV radiation to reduce your risk of skin cancer. Safety Always wear your seat belt while driving or riding in a vehicle. Do not drive: If you have been drinking alcohol. Do not ride with someone who has been drinking. When you are tired or distracted. While texting. If you have been using any mind-altering substances or drugs. Wear a helmet and other protective equipment during sports activities. If you have firearms in your house, make sure you follow all gun safety procedures. Seek help if you have been physically or sexually abused. What's next? Visit your health care provider once a year for an annual wellness visit. Ask your health care provider how often you should have your eyes and teeth checked. Stay up to date on all vaccines. This information is not intended to replace advice given to you by your health care provider. Make sure you discuss any questions you have with your health care provider. Document Revised: 09/02/2020 Document Reviewed: 09/02/2020 Elsevier Patient Education  2024 Elsevier Inc.    Signed,   Meredith Staggers, MD  [facility], [facility] 04/12/23 10:32 AM

## 2023-04-12 NOTE — Patient Instructions (Addendum)
[facility] Weight [facility]  [phone]  If any concerns on labs I will let you know.  Phone number above is an option if needed for weight loss but let me know if I can help further.  Flu vaccine and first shingles vaccine given today.  Repeat shingles vaccine between 2 and 6 months at a nurse visit is fine.  Pneumonia vaccine can be given anytime.  We can perform that here or you can have that at your pharmacy.  Take care.    Preventive Care 52-90 Years Old, Female Preventive care refers to lifestyle choices and visits with your health care provider that can promote health and wellness. Preventive care visits are also called wellness exams. What can I expect for my preventive care visit? Counseling Your health care provider may ask you questions about your: Medical history, including: Past medical problems. Family medical history. Pregnancy history. Current health, including: Menstrual cycle. Method of birth control. Emotional well-being. Home life and relationship well-being. Sexual activity and sexual health. Lifestyle, including: Alcohol, nicotine or tobacco, and drug use. Access to firearms. Diet, exercise, and sleep habits. Work and work Astronomer. Sunscreen use. Safety issues such as seatbelt and bike helmet use. Physical exam Your health care provider will check your: Height and weight. These may be used to calculate your BMI (body mass index). BMI is a measurement that tells if you are at a healthy weight. Waist circumference. This measures the distance around your waistline. This measurement also tells if you are at a healthy weight and may help predict your risk of certain diseases, such as type 2 diabetes and high blood pressure. Heart rate and blood pressure. Body temperature. Skin for abnormal spots. What immunizations do I need?  Vaccines are usually given at various ages, according to a schedule. Your health care provider will  recommend vaccines for you based on your age, medical history, and lifestyle or other factors, such as travel or where you work. What tests do I need? Screening Your health care provider may recommend screening tests for certain conditions. This may include: Lipid and cholesterol levels. Diabetes screening. This is done by checking your blood sugar (glucose) after you have not eaten for a while (fasting). Pelvic exam and Pap test. Hepatitis B test. Hepatitis C test. HIV (human immunodeficiency virus) test. STI (sexually transmitted infection) testing, if you are at risk. Lung cancer screening. Colorectal cancer screening. Mammogram. Talk with your health care provider about when you should start having regular mammograms. This may depend on whether you have a family history of breast cancer. BRCA-related cancer screening. This may be done if you have a family history of breast, ovarian, tubal, or peritoneal cancers. Bone density scan. This is done to screen for osteoporosis. Talk with your health care provider about your test results, treatment options, and if necessary, the need for more tests. Follow these instructions at home: Eating and drinking  Eat a diet that includes fresh fruits and vegetables, whole grains, lean protein, and low-fat dairy products. Take vitamin and mineral supplements as recommended by your health care provider. Do not drink alcohol if: Your health care provider tells you not to drink. You are pregnant, may be pregnant, or are planning to become pregnant. If you drink alcohol: Limit how much you have to 0-1 drink a day. Know how much alcohol is in your drink. In the [place] one drink equals one 12 oz bottle of beer (355 mL), one 5 oz glass of wine (  148 mL), or one 1 oz glass of hard liquor (44 mL). Lifestyle Brush your teeth every morning and night with fluoride toothpaste. Floss one time each day. Exercise for at least 30 minutes 5 or more days each week. Do  not use any products that contain nicotine or tobacco. These products include cigarettes, chewing tobacco, and vaping devices, such as e-cigarettes. If you need help quitting, ask your health care provider. Do not use drugs. If you are sexually active, practice safe sex. Use a condom or other form of protection to prevent STIs. If you do not wish to become pregnant, use a form of birth control. If you plan to become pregnant, see your health care provider for a prepregnancy visit. Take aspirin only as told by your health care provider. Make sure that you understand how much to take and what form to take. Work with your health care provider to find out whether it is safe and beneficial for you to take aspirin daily. Find healthy ways to manage stress, such as: Meditation, yoga, or listening to music. Journaling. Talking to a trusted person. Spending time with friends and family. Minimize exposure to UV radiation to reduce your risk of skin cancer. Safety Always wear your seat belt while driving or riding in a vehicle. Do not drive: If you have been drinking alcohol. Do not ride with someone who has been drinking. When you are tired or distracted. While texting. If you have been using any mind-altering substances or drugs. Wear a helmet and other protective equipment during sports activities. If you have firearms in your house, make sure you follow all gun safety procedures. Seek help if you have been physically or sexually abused. What's next? Visit your health care provider once a year for an annual wellness visit. Ask your health care provider how often you should have your eyes and teeth checked. Stay up to date on all vaccines. This information is not intended to replace advice given to you by your health care provider. Make sure you discuss any questions you have with your health care provider. Document Revised: 09/02/2020 Document Reviewed: 09/02/2020 Elsevier Patient Education  2024  [employer].

## 2023-04-13 LAB — HEPATITIS C ANTIBODY: Hepatitis C Ab: NONREACTIVE

## 2023-04-19 ENCOUNTER — Telehealth: Payer: Self-pay

## 2023-04-19 ENCOUNTER — Encounter: Payer: Self-pay | Admitting: Family Medicine

## 2023-04-19 NOTE — Telephone Encounter (Signed)
Copied from CRM [id number]. Topic: Clinical - Lab/Test Results >> Apr 19, 2023  1:13 PM Orinda Kenner C wrote: Reason for CRM: Patient [phone] would like lab results, please call back. Patient is unablet to go on MyChart.

## 2023-04-19 NOTE — Telephone Encounter (Signed)
MyChart is currently down, I will call patient once you have had the opportunity to review lab results

## 2023-04-20 NOTE — Telephone Encounter (Signed)
See lab notes from yesterday.

## 2023-04-24 ENCOUNTER — Telehealth: Payer: Self-pay

## 2023-04-24 NOTE — Telephone Encounter (Signed)
Copied from CRM [id number]. Topic: General - Other >> Apr 24, 2023  2:23 PM [place] A wrote: Reason for CRM: Patient called regarding her lab results. Please call patient

## 2023-04-24 NOTE — Telephone Encounter (Signed)
 Patient has been notified

## 2023-04-25 ENCOUNTER — Telehealth: Payer: Self-pay

## 2023-04-25 NOTE — Telephone Encounter (Signed)
 Patient has reviewed labs via MyChart.

## 2023-04-25 NOTE — Telephone Encounter (Signed)
-----   Message from Shade Flood sent at 04/24/2023 12:31 PM EST ----- Results sent by MyChart, but appears patient has not yet reviewed those results.  Please call and make sure they have either seen note or discuss result note.  Thanks.

## 2023-06-12 ENCOUNTER — Ambulatory Visit (INDEPENDENT_AMBULATORY_CARE_PROVIDER_SITE_OTHER): Payer: 59

## 2023-06-12 DIAGNOSIS — Z23 Encounter for immunization: Secondary | ICD-10-CM

## 2023-06-12 NOTE — Progress Notes (Signed)
 Jessica Mccarthy is a 53 y.o. female presents to the office today for Shingrix  per physician's orders. Injection was administered Intramuscular Right deltoid.   Patient's next injection due none due series is now complete.   Channing Savich K Leotha Voeltz

## 2023-08-10 ENCOUNTER — Ambulatory Visit (INDEPENDENT_AMBULATORY_CARE_PROVIDER_SITE_OTHER): Admitting: Family Medicine

## 2023-08-10 ENCOUNTER — Encounter: Payer: Self-pay | Admitting: Family Medicine

## 2023-08-10 ENCOUNTER — Ambulatory Visit: Payer: Self-pay

## 2023-08-10 VITALS — BP 102/68 | HR 80 | Temp 99.4°F | Ht 66.0 in | Wt 245.4 lb

## 2023-08-10 DIAGNOSIS — R079 Chest pain, unspecified: Secondary | ICD-10-CM

## 2023-08-10 LAB — CBC WITH DIFFERENTIAL/PLATELET
Basophils Absolute: 0.1 10*3/uL (ref 0.0–0.1)
Basophils Relative: 1.1 % (ref 0.0–3.0)
Eosinophils Absolute: 0 10*3/uL (ref 0.0–0.7)
Eosinophils Relative: 0.7 % (ref 0.0–5.0)
HCT: 40.9 % (ref 36.0–46.0)
Hemoglobin: 13.7 g/dL (ref 12.0–15.0)
Lymphocytes Relative: 30.8 % (ref 12.0–46.0)
Lymphs Abs: 1.5 10*3/uL (ref 0.7–4.0)
MCHC: 33.6 g/dL (ref 30.0–36.0)
MCV: 82.2 fl (ref 78.0–100.0)
Monocytes Absolute: 0.4 10*3/uL (ref 0.1–1.0)
Monocytes Relative: 7.2 % (ref 3.0–12.0)
Neutro Abs: 3 10*3/uL (ref 1.4–7.7)
Neutrophils Relative %: 60.2 % (ref 43.0–77.0)
Platelets: 378 10*3/uL (ref 150.0–400.0)
RBC: 4.98 Mil/uL (ref 3.87–5.11)
RDW: 13.3 % (ref 11.5–15.5)
WBC: 4.9 10*3/uL (ref 4.0–10.5)

## 2023-08-10 LAB — BASIC METABOLIC PANEL WITH GFR
BUN: 10 mg/dL (ref 6–23)
CO2: 28 meq/L (ref 19–32)
Calcium: 9.1 mg/dL (ref 8.4–10.5)
Chloride: 103 meq/L (ref 96–112)
Creatinine, Ser: 0.82 mg/dL (ref 0.40–1.20)
GFR: 82.99 mL/min (ref >60.00)
Glucose, Bld: 82 mg/dL (ref 70–99)
Potassium: 4.3 meq/L (ref 3.5–5.1)
Sodium: 136 meq/L (ref 135–145)

## 2023-08-10 LAB — HEPATIC FUNCTION PANEL
ALT: 23 U/L (ref 0–35)
AST: 18 U/L (ref 0–37)
Albumin: 4.2 g/dL (ref 3.5–5.2)
Alkaline Phosphatase: 69 U/L (ref 39–117)
Bilirubin, Direct: 0.1 mg/dL (ref 0.0–0.3)
Total Bilirubin: 0.5 mg/dL (ref 0.2–1.2)
Total Protein: 7.4 g/dL (ref 6.0–8.3)

## 2023-08-10 LAB — TSH: TSH: 2.98 u[IU]/mL (ref 0.35–5.50)

## 2023-08-10 LAB — TROPONIN I (HIGH SENSITIVITY): High Sens Troponin I: 4 ng/L (ref 2–17)

## 2023-08-10 NOTE — Progress Notes (Signed)
   Subjective:    Patient ID: Jessica Mccarthy, female    DOB: [birth year], 53 y.o.   MRN: [id number]  HPI Chest pain- pt reports high levels of stress recently.  Pain is R sided.  Feels that sxs are more notable during work or stressful conversations.  No TTP.  Denies any indigestion/reflux/heartburn.  Denies SOB, diaphoresis, nausea, dizziness.  Pt reports sxs have been building over the last month as stress levels have worsened.  Is asymptomatic when not at work or stress level is manageable.  Denies chest pain or pressure w/ exertion.  Currently asymptomatic   Review of Systems For ROS see HPI     Objective:   Physical Exam Vitals reviewed.  Constitutional:      General: She is not in acute distress.    Appearance: She is well-developed. She is not ill-appearing.  HENT:     Head: Normocephalic and atraumatic.  Eyes:     Conjunctiva/sclera: Conjunctivae normal.     Pupils: Pupils are equal, round, and reactive to light.  Neck:     Thyroid: No thyromegaly.  Cardiovascular:     Rate and Rhythm: Normal rate and regular rhythm.     Heart sounds: Normal heart sounds. No murmur heard. Pulmonary:     Effort: Pulmonary effort is normal. No respiratory distress.     Breath sounds: Normal breath sounds.  Abdominal:     General: There is no distension.     Palpations: Abdomen is soft.     Tenderness: There is no abdominal tenderness.  Musculoskeletal:     Cervical back: Normal range of motion and neck supple.  Lymphadenopathy:     Cervical: No cervical adenopathy.  Skin:    General: Skin is warm and dry.  Neurological:     Mental Status: She is alert and oriented to person, place, and time.  Psychiatric:        Behavior: Behavior normal.           Assessment & Plan:  R sided CP- new.  Pt is currently asymptomatic in office.  Does not have sxs when at home or somewhere outside of work.  Does not have sxs w/ exertion.  This is unlikely to be cardiac chest pain as it only occurs  when she is upset about something at work.  She denies GERD.  Denies associated SOB, diaphoresis, dizziness, N/V.  Suspect this is anxiety and pt agrees.  Will check labs- including troponin- to r/o cardiac or other underlying issue.  Will hold off on EKG as pt is asymptomatic today.  Encouraged relaxation, deep breathing, and discussed medication.  She will consider medication if labs all return normal.  Reviewed supportive care and red flags that should prompt return.  Pt expressed understanding and is in agreement w/ plan.

## 2023-08-10 NOTE — Patient Instructions (Signed)
 Follow up as needed or as scheduled We'll notify you of your lab results and make any changes if needed Try and do some deep breathing and relaxation when things start to get crazy This does NOT sound like cardiac chest pain but we're going to make sure IF your symptoms were to change or worsen- worsening chest pain, shortness of breath, dizziness, nausea, etc- please go to the ER Call with any questions or concerns Hang in there!!

## 2023-08-10 NOTE — Telephone Encounter (Signed)
 Chief Complaint: chest pain Symptoms: chest pain, racing heart  Frequency: x 3 days Pertinent Negatives: Patient denies sob Disposition: [] ED /[] Urgent Care (no appt availability in office) / [x] Appointment(In office/virtual)/ []  [facility]/ [] Home Care/ [] Refused Recommended Disposition /[] [facility]/ []  Follow-up with PCP Additional Notes: Pt states she has been experiencing chest pain. States she notices that it really comes on when she is stressing.  States feels like a tightening in the chest and every now and again a sudden pain. States this started a couple days ago  and that she has had a really rough week at work. States she could feel her heart racing last night not not racing today.    Copied from CRM [id number]. Topic: Clinical - Red Word Triage >> Aug 10, 2023  8:23 AM Dimple Francis wrote: Red Word that prompted transfer to Nurse Triage: Patient having chest pain, does not hurt when she breathes. Started a few days ago. Patient is not sick Reason for Disposition  [1] Chest pain lasts > 5 minutes AND [2] occurred > 3 days ago (72 hours) AND [3] NO chest pain or cardiac symptoms now  Answer Assessment - Initial Assessment Questions 1. LOCATION: "Where does it hurt?"       Mid-right chest 2. RADIATION: "Does the pain go anywhere else?" (e.g., into neck, jaw, arms, back)     no 3. ONSET: "When did the chest pain begin?" (Minutes, hours or days)      A couple days ago 4. PATTERN: "Does the pain come and go, or has it been constant since it started?"  "Does it get worse with exertion?"      Comes goes 5. DURATION: "How long does it last" (e.g., seconds, minutes, hours)     Dont know 6. SEVERITY: "How bad is the pain?"  (e.g., Scale 1-10; mild, moderate, or severe)    - MILD (1-3): doesn't interfere with normal activities     - MODERATE (4-7): interferes with normal activities or awakens from sleep    - SEVERE (8-10): excruciating pain, unable to do any normal  activities       5/10 7. CARDIAC RISK FACTORS: "Do you have any history of heart problems or risk factors for heart disease?" (e.g., angina, prior heart attack; diabetes, high blood pressure, high cholesterol, smoker, or strong family history of heart disease)     no 8. PULMONARY RISK FACTORS: "Do you have any history of lung disease?"  (e.g., blood clots in lung, asthma, emphysema, birth control pills)     no 9. CAUSE: "What do you think is causing the chest pain?"     stress 10. OTHER SYMPTOMS: "Do you have any other symptoms?" (e.g., dizziness, nausea, vomiting, sweating, fever, difficulty breathing, cough)       no  Protocols used: Chest Pain-A-AH

## 2023-08-10 NOTE — Telephone Encounter (Signed)
 Noted. Appreciate Dr. Paulla Bossier seeing her.

## 2023-08-11 ENCOUNTER — Ambulatory Visit: Payer: Self-pay | Admitting: Family Medicine

## 2024-01-10 ENCOUNTER — Other Ambulatory Visit: Payer: Self-pay | Admitting: Obstetrics and Gynecology

## 2024-01-10 DIAGNOSIS — Z1231 Encounter for screening mammogram for malignant neoplasm of breast: Secondary | ICD-10-CM

## 2024-02-06 ENCOUNTER — Ambulatory Visit

## 2024-02-20 ENCOUNTER — Ambulatory Visit
Admission: RE | Admit: 2024-02-20 | Discharge: 2024-02-20 | Disposition: A | Source: Ambulatory Visit | Attending: Obstetrics and Gynecology | Admitting: Obstetrics and Gynecology

## 2024-02-20 DIAGNOSIS — Z1231 Encounter for screening mammogram for malignant neoplasm of breast: Secondary | ICD-10-CM

## 2024-04-12 ENCOUNTER — Ambulatory Visit: Payer: 59 | Admitting: Family Medicine

## 2024-04-12 ENCOUNTER — Encounter: Payer: Self-pay | Admitting: Family Medicine

## 2024-04-12 VITALS — BP 126/84 | HR 75 | Temp 98.3°F | Resp 18 | Ht 66.0 in | Wt 243.0 lb

## 2024-04-12 DIAGNOSIS — Z1329 Encounter for screening for other suspected endocrine disorder: Secondary | ICD-10-CM | POA: Diagnosis not present

## 2024-04-12 DIAGNOSIS — Z13220 Encounter for screening for lipoid disorders: Secondary | ICD-10-CM

## 2024-04-12 DIAGNOSIS — Z131 Encounter for screening for diabetes mellitus: Secondary | ICD-10-CM

## 2024-04-12 DIAGNOSIS — Z23 Encounter for immunization: Secondary | ICD-10-CM

## 2024-04-12 DIAGNOSIS — Z130 Encounter for screening for diseases of the blood and blood-forming organs and certain disorders involving the immune mechanism: Secondary | ICD-10-CM

## 2024-04-12 DIAGNOSIS — Z0000 Encounter for general adult medical examination without abnormal findings: Secondary | ICD-10-CM

## 2024-04-12 DIAGNOSIS — E66812 Obesity, class 2: Secondary | ICD-10-CM

## 2024-04-12 DIAGNOSIS — Z6839 Body mass index (BMI) 39.0-39.9, adult: Secondary | ICD-10-CM | POA: Diagnosis not present

## 2024-04-12 LAB — COMPREHENSIVE METABOLIC PANEL WITH GFR
ALT: 12 U/L (ref 3–35)
AST: 12 U/L (ref 5–37)
Albumin: 4.3 g/dL (ref 3.5–5.2)
Alkaline Phosphatase: 80 U/L (ref 39–117)
BUN: 11 mg/dL (ref 6–23)
CO2: 33 meq/L — ABNORMAL HIGH (ref 19–32)
Calcium: 9.6 mg/dL (ref 8.4–10.5)
Chloride: 102 meq/L (ref 96–112)
Creatinine, Ser: 0.84 mg/dL (ref 0.40–1.20)
GFR: 80.24 mL/min
Glucose, Bld: 85 mg/dL (ref 70–99)
Potassium: 5 meq/L (ref 3.5–5.1)
Sodium: 139 meq/L (ref 135–145)
Total Bilirubin: 0.5 mg/dL (ref 0.2–1.2)
Total Protein: 7.3 g/dL (ref 6.0–8.3)

## 2024-04-12 LAB — LIPID PANEL
Cholesterol: 192 mg/dL (ref 28–200)
HDL: 60.7 mg/dL
LDL Cholesterol: 121 mg/dL — ABNORMAL HIGH (ref 10–99)
NonHDL: 130.81
Total CHOL/HDL Ratio: 3
Triglycerides: 48 mg/dL (ref 10.0–149.0)
VLDL: 9.6 mg/dL (ref 0.0–40.0)

## 2024-04-12 LAB — CBC
HCT: 42.2 % (ref 36.0–46.0)
Hemoglobin: 14.3 g/dL (ref 12.0–15.0)
MCHC: 34 g/dL (ref 30.0–36.0)
MCV: 83.4 fl (ref 78.0–100.0)
Platelets: 420 K/uL — ABNORMAL HIGH (ref 150.0–400.0)
RBC: 5.06 Mil/uL (ref 3.87–5.11)
RDW: 13 % (ref 11.5–15.5)
WBC: 4.7 K/uL (ref 4.0–10.5)

## 2024-04-12 LAB — HEMOGLOBIN A1C: Hgb A1c MFr Bld: 5.5 % (ref 4.6–6.5)

## 2024-04-12 NOTE — Patient Instructions (Addendum)
 Tai chi may be helpful.  Goals for exercise: most days per week, low intensity, per week, and a combo of cardio and resistance.  Remember the start low, go slow approach and reasonable goals.  As we discussed weight is stable from last year.  Here is a resource locally for weight management if you would like.  Flu vaccine and pneumonia vaccines were updated today. Thank you for coming in today. . If there are any concerns on your bloodwork, I will let you know. Take care!  [facility] Weight Loss [facility]  [phone]    Preventive Care 52-52 Years Old, Female Preventive care refers to lifestyle choices and visits with your health care provider that can promote health and wellness. Preventive care visits are also called wellness exams. What can I expect for my preventive care visit? Counseling Your health care provider may ask you questions about your: Medical history, including: Past medical problems. Family medical history. Pregnancy history. Current health, including: Menstrual cycle. Method of birth control. Emotional well-being. Home life and relationship well-being. Sexual activity and sexual health. Lifestyle, including: Alcohol, nicotine or tobacco, and drug use. Access to firearms. Diet, exercise, and sleep habits. Work and work astronomer. Sunscreen use. Safety issues such as seatbelt and bike helmet use. Physical exam Your health care provider will check your: Height and weight. These may be used to calculate your BMI (body mass index). BMI is a measurement that tells if you are at a healthy weight. Waist circumference. This measures the distance around your waistline. This measurement also tells if you are at a healthy weight and may help predict your risk of certain diseases, such as type 2 diabetes and high blood pressure. Heart rate and blood pressure. Body temperature. Skin for abnormal spots. What immunizations do I  need?  Vaccines are usually given at various ages, according to a schedule. Your health care provider will recommend vaccines for you based on your age, medical history, and lifestyle or other factors, such as travel or where you work. What tests do I need? Screening Your health care provider may recommend screening tests for certain conditions. This may include: Lipid and cholesterol levels. Diabetes screening. This is done by checking your blood sugar (glucose) after you have not eaten for a while (fasting). Pelvic exam and Pap test. Hepatitis B test. Hepatitis C test. HIV (human immunodeficiency virus) test. STI (sexually transmitted infection) testing, if you are at risk. Lung cancer screening. Colorectal cancer screening. Mammogram. Talk with your health care provider about when you should start having regular mammograms. This may depend on whether you have a family history of breast cancer. BRCA-related cancer screening. This may be done if you have a family history of breast, ovarian, tubal, or peritoneal cancers. Bone density scan. This is done to screen for osteoporosis. Talk with your health care provider about your test results, treatment options, and if necessary, the need for more tests. Follow these instructions at home: Eating and drinking  Eat a diet that includes fresh fruits and vegetables, whole grains, lean protein, and low-fat dairy products. Take vitamin and mineral supplements as recommended by your health care provider. Do not drink alcohol if: Your health care provider tells you not to drink. You are pregnant, may be pregnant, or are planning to become pregnant. If you drink alcohol: Limit how much you have to 0-1 drink a day. Know how much alcohol is in your drink. In the [place] one drink equals one 12 oz  bottle of beer (355 mL), one 5 oz glass of wine (148 mL), or one 1 oz glass of hard liquor (44 mL). Lifestyle Brush your teeth every morning and night with  fluoride toothpaste. Floss one time each day. Exercise for at least 30 minutes 5 or more days each week. Do not use any products that contain nicotine or tobacco. These products include cigarettes, chewing tobacco, and vaping devices, such as e-cigarettes. If you need help quitting, ask your health care provider. Do not use drugs. If you are sexually active, practice safe sex. Use a condom or other form of protection to prevent STIs. If you do not wish to become pregnant, use a form of birth control. If you plan to become pregnant, see your health care provider for a prepregnancy visit. Take aspirin only as told by your health care provider. Make sure that you understand how much to take and what form to take. Work with your health care provider to find out whether it is safe and beneficial for you to take aspirin daily. Find healthy ways to manage stress, such as: Meditation, yoga, or listening to music. Journaling. Talking to a trusted person. Spending time with friends and family. Minimize exposure to UV radiation to reduce your risk of skin cancer. Safety Always wear your seat belt while driving or riding in a vehicle. Do not drive: If you have been drinking alcohol. Do not ride with someone who has been drinking. When you are tired or distracted. While texting. If you have been using any mind-altering substances or drugs. Wear a helmet and other protective equipment during sports activities. If you have firearms in your house, make sure you follow all gun safety procedures. Seek help if you have been physically or sexually abused. What's next? Visit your health care provider once a year for an annual wellness visit. Ask your health care provider how often you should have your eyes and teeth checked. Stay up to date on all vaccines. This information is not intended to replace advice given to you by your health care provider. Make sure you discuss any questions you have with your health  care provider. Document Revised: 09/02/2020 Document Reviewed: 09/02/2020 Elsevier Patient Education  2024 [employer].

## 2024-04-12 NOTE — Progress Notes (Signed)
 "  Subjective:  Patient ID: Jessica Mccarthy, female    DOB: 04-23-1970  Age: 54 y.o. MRN: [id number]  CC:  Chief Complaint  Patient presents with   Annual Exam    No questions or concerns.     HPI Jessica Mccarthy presents for Annual Exam  PCP, [facility], Rexene Hoit at [facility].  Last visit on 10/26/2023.  Aunt passed last year at age 16. Mother had TIA at age 81 last year. Doing ok now.  No health changes since last year.    Obesity Discussed at previous physicals.  She had planned on meeting with a specialist last year, had undergone weight watchers treatment in the past.  Did not want to pursue surgical treatment.  Weight is stable from her last physical in January 2025. Has not met with weight specialist.  No regular exercise, but plans some home exercises, and Tai Chi, dance and fitness class. Goals discussed - FITT and start low/go slow approach.  Body mass index is 39.22 kg/m.  Wt Readings from Last 3 Encounters:  04/12/24 243 lb (110.2 kg)  08/10/23 245 lb 6.4 oz (111.3 kg)  04/12/23 243 lb 12.8 oz (110.6 kg)       04/12/2024   10:48 AM 08/10/2023   11:20 AM 04/12/2023   10:13 AM 01/27/2023    9:02 AM 05/16/2022   11:40 AM  Depression screen PHQ 2/9  Decreased Interest 1 0 0 0 0  Down, Depressed, Hopeless 0 0 0 0 0  PHQ - 2 Score 1 0 0 0 0  Altered sleeping 1  0 0 0  Tired, decreased energy 1  0 0 0  Change in appetite 0  0 0 0  Feeling bad or failure about yourself  0  0 0 0  Trouble concentrating 0  0 0 0  Moving slowly or fidgety/restless 0  0 0 0  Suicidal thoughts 0  0 0 0  PHQ-9 Score 3  0  0  0   Difficult doing work/chores Not difficult at all         Data saved with a previous flowsheet row definition    Health Maintenance  Topic Date Due   Influenza Vaccine  10/20/2023   COVID-19 Vaccine (2 - 2025-26 season) 04/28/2024 (Originally 11/20/2023)   Pneumococcal Vaccine: 50+ Years (1 of 1 - PCV) 04/12/2025 (Originally 06/21/2022)   Hepatitis B Vaccines 19-59  Average Risk (1 of 3 - 19+ 3-dose series) 04/12/2025 (Originally 06/21/1991)   Mammogram  02/19/2026   DTaP/Tdap/Td (2 - Td or Tdap) 03/07/2029   Colonoscopy  11/11/2030   HPV VACCINES (No Doses Required) Completed   Hepatitis C Screening  Completed   HIV Screening  Completed   Zoster Vaccines- Shingrix   Completed   Meningococcal B Vaccine  Aged Out  Colon cancer screening, colonoscopy with [facility], Chart reviewed, colonoscopy 11/10/2020, Dr. Elicia.  Diminutive polyp removed.  Repeat in 5 to 10 years based on pathology results.  Pathology report reviewed.  Hyperplastic polyp.  Pap testing through GYN as above. Mammogram 02/20/24, no evidence of malignancy. Considering breast reduction.   Immunization History  Administered Date(s) Administered   Influenza, Seasonal, Injecte, Preservative Fre 04/12/2023   Influenza,inj,Quad PF,6+ Mos 01/03/2019, 03/30/2022   Influenza-Unspecified 12/20/2015   Janssen (J&J) SARS-COV-2 Vaccination 06/28/2019   Tdap 03/08/2019   Zoster Recombinant(Shingrix ) 04/12/2023, 06/12/2023  Flu vaccine today.  Covid booster - declined.  Prevnar 20 recommended - today.   No results found. Wears glasses -  optho every 2 years. Appt few months ago.   Dental: every 6 months.   Alcohol: occasional - not weekly.   Tobacco: none   Exercise: as above.    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 Authorizing Provider  clotrimazole  (LOTRIMIN ) 1 % cream Apply 1 Application topically 2 (two) times daily. Patient taking differently: Apply 1 Application topically 2 (two) times daily. As needed 03/30/22  Yes Levora Reyes SAUNDERS, MD  nystatin  powder Apply topically 3 (three) times daily. Patient taking differently: Apply topically 3 (three) times daily. As needed 03/30/22  Yes Levora Reyes SAUNDERS, MD   Social History   Socioeconomic History   Marital status: Legally Separated    Spouse name: Not on file   Number of children: 1   Years of education: College   Highest education level: Not on file  Occupational History   Occupation: Sales Promotion Account Executive: [employer]    Comment: [employer]  Tobacco Use   Smoking status: Never   Smokeless tobacco: Never  Vaping Use   Vaping status: Never Used  Substance and Sexual Activity   Alcohol use: Yes    Alcohol/week: 0.0 - 1.0 standard drinks of alcohol   Drug use: No   Sexual activity: Yes    Partners: Male    Birth control/protection: Surgical  Other Topics Concern   Not on file  Social History Narrative   Daughter lives with her.   Married 1999. Legally separated 2017.   Social Drivers of Health   Tobacco Use: Low Risk (04/12/2024)   Patient History    Smoking Tobacco Use: Never    Smokeless Tobacco Use: Never    Passive Exposure: Not on file  Financial Resource Strain: Low Risk (08/10/2023)   Overall Financial Resource Strain (CARDIA)    Difficulty of Paying Living Expenses: Not hard at all  Food Insecurity: Not on file  Transportation Needs: Not on file  Physical Activity: Inactive (08/10/2023)   Exercise Vital Sign    Days of Exercise per Week: 0 days    Minutes of Exercise per Session: 0 min  Stress: Stress Concern Present (08/10/2023)   [employer] of Occupational Health - Occupational Stress Questionnaire    Feeling of Stress : Very much  Social Connections: Moderately Integrated (08/10/2023)   Social Connection and Isolation Panel    Frequency of Communication with Friends and Family: More than three times a week    Frequency of Social Gatherings with Friends and Family: Once a week    Attends Religious Services:  More than 4 times per year    Active Member of [employer] or Organizations: Yes    Attends Banker Meetings: More than 4 times per year    Marital Status: Divorced  Intimate Partner Violence: Not At Risk (08/10/2023)   Humiliation, Afraid, Rape, and Kick questionnaire    Fear of Current or Ex-Partner: No    Emotionally Abused: No    Physically Abused: No    Sexually Abused: No  Depression (PHQ2-9): Low Risk (04/12/2024)   Depression (PHQ2-9)    PHQ-2 Score: 3  Alcohol Screen:  Low Risk (08/10/2023)   Alcohol Screen    Last Alcohol Screening Score (AUDIT): 1  Housing: Not on file  Utilities: Not on file  Health Literacy: Not on file    Review of Systems  13 point review of systems per patient health survey noted.  Negative other than as indicated above or in HPI.   Objective:   Vitals:   04/12/24 1045  BP: 126/84  Pulse: 75  Resp: 18  Temp: 98.3 F (36.8 C)  TempSrc: Temporal  SpO2: 99%  Weight: 243 lb (110.2 kg)  Height: 5' 6 (1.676 m)     Physical Exam Constitutional:      Appearance: She is well-developed.  HENT:     Head: Normocephalic and atraumatic.     Right Ear: External ear normal.     Left Ear: External ear normal.  Eyes:     Conjunctiva/sclera: Conjunctivae normal.     Pupils: Pupils are equal, round, and reactive to light.  Neck:     Thyroid: No thyromegaly.  Cardiovascular:     Rate and Rhythm: Normal rate and regular rhythm.     Heart sounds: Normal heart sounds. No murmur heard. Pulmonary:     Effort: Pulmonary effort is normal. No respiratory distress.     Breath sounds: Normal breath sounds. No wheezing.  Abdominal:     General: Bowel sounds are normal.     Palpations: Abdomen is soft.     Tenderness: There is no abdominal tenderness.  Musculoskeletal:        General: No tenderness. Normal range of motion.     Cervical back: Normal range of motion and neck supple.  Lymphadenopathy:     Cervical: No cervical adenopathy.  Skin:     General: Skin is warm and dry.     Findings: No rash.  Neurological:     Mental Status: She is alert and oriented to person, place, and time.  Psychiatric:        Behavior: Behavior normal.        Thought Content: Thought content normal.    Assessment & Plan:  Jessica Mccarthy is a 54 y.o. female . Annual physical exam - Plan: CBC, TSH, Hemoglobin A1c, Lipid panel, Comprehensive metabolic panel with GFR  - -anticipatory guidance as below in AVS, screening labs above. Health maintenance items as above in HPI discussed/recommended as applicable.   Need for influenza vaccination - Plan: Flu vaccine trivalent PF, 6mos and older(Flulaval,Afluria,Fluarix,Fluzone)  Screening for diabetes mellitus - Plan: Hemoglobin A1c  Screening for hyperlipidemia - Plan: Lipid panel, Comprehensive metabolic panel with GFR  Class 2 obesity with body mass index (BMI) of 39.0 to 39.9 in adult, unspecified obesity type, unspecified whether serious comorbidity present  - Exercise recommendations discussed, she plans to start tai chi, and home exercises.  Commended on maintenance of same weight from last year.  Resources provided for healthy weight and wellness, weight management options.  Advised to let me know if I can help further.  Screening labs as above.  Need for pneumococcal vaccination - Plan: Pneumococcal conjugate vaccine 20-valent (Prevnar 20)  Screening for thyroid disorder - Plan: TSH  Screening, anemia, deficiency, iron - Plan: CBC   No orders of the defined types were placed in this encounter.  Patient Instructions  Tai chi may be helpful.  Goals for exercise: most days per week, low intensity, per week, and a combo of cardio and resistance.  Remember the start low, go slow approach and reasonable  goals.  As we discussed weight is stable from last year.  Here is a resource locally for weight management if you would like.  Flu vaccine and pneumonia vaccines were updated today. Thank you  for coming in today. . If there are any concerns on your bloodwork, I will let you know. Take care!  [facility] Medical Weight Loss Management  [phone]    Preventive Care 69-51 Years Old, Female Preventive care refers to lifestyle choices and visits with your health care provider that can promote health and wellness. Preventive care visits are also called wellness exams. What can I expect for my preventive care visit? Counseling Your health care provider may ask you questions about your: Medical history, including: Past medical problems. Family medical history. Pregnancy history. Current health, including: Menstrual cycle. Method of birth control. Emotional well-being. Home life and relationship well-being. Sexual activity and sexual health. Lifestyle, including: Alcohol, nicotine or tobacco, and drug use. Access to firearms. Diet, exercise, and sleep habits. Work and work astronomer. Sunscreen use. Safety issues such as seatbelt and bike helmet use. Physical exam Your health care provider will check your: Height and weight. These may be used to calculate your BMI (body mass index). BMI is a measurement that tells if you are at a healthy weight. Waist circumference. This measures the distance around your waistline. This measurement also tells if you are at a healthy weight and may help predict your risk of certain diseases, such as type 2 diabetes and high blood pressure. Heart rate and blood pressure. Body temperature. Skin for abnormal spots. What immunizations do I need?  Vaccines are usually given at various ages, according to a schedule. Your health care provider will recommend vaccines for you based on your age, medical history, and lifestyle or other factors, such as travel or where you work. What tests do I need? Screening Your health care provider may recommend screening tests for certain conditions. This may include: Lipid and cholesterol  levels. Diabetes screening. This is done by checking your blood sugar (glucose) after you have not eaten for a while (fasting). Pelvic exam and Pap test. Hepatitis B test. Hepatitis C test. HIV (human immunodeficiency virus) test. STI (sexually transmitted infection) testing, if you are at risk. Lung cancer screening. Colorectal cancer screening. Mammogram. Talk with your health care provider about when you should start having regular mammograms. This may depend on whether you have a family history of breast cancer. BRCA-related cancer screening. This may be done if you have a family history of breast, ovarian, tubal, or peritoneal cancers. Bone density scan. This is done to screen for osteoporosis. Talk with your health care provider about your test results, treatment options, and if necessary, the need for more tests. Follow these instructions at home: Eating and drinking  Eat a diet that includes fresh fruits and vegetables, whole grains, lean protein, and low-fat dairy products. Take vitamin and mineral supplements as recommended by your health care provider. Do not drink alcohol if: Your health care provider tells you not to drink. You are pregnant, may be pregnant, or are planning to become pregnant. If you drink alcohol: Limit how much you have to 0-1 drink a day. Know how much alcohol is in your drink. In the [place] one drink equals one 12 oz bottle of beer (355 mL), one 5 oz glass of wine (148 mL), or one 1 oz glass of hard liquor (44 mL). Lifestyle Brush your teeth every morning and night with fluoride toothpaste.  Floss one time each day. Exercise for at least 30 minutes 5 or more days each week. Do not use any products that contain nicotine or tobacco. These products include cigarettes, chewing tobacco, and vaping devices, such as e-cigarettes. If you need help quitting, ask your health care provider. Do not use drugs. If you are sexually active, practice safe sex. Use a  condom or other form of protection to prevent STIs. If you do not wish to become pregnant, use a form of birth control. If you plan to become pregnant, see your health care provider for a prepregnancy visit. Take aspirin only as told by your health care provider. Make sure that you understand how much to take and what form to take. Work with your health care provider to find out whether it is safe and beneficial for you to take aspirin daily. Find healthy ways to manage stress, such as: Meditation, yoga, or listening to music. Journaling. Talking to a trusted person. Spending time with friends and family. Minimize exposure to UV radiation to reduce your risk of skin cancer. Safety Always wear your seat belt while driving or riding in a vehicle. Do not drive: If you have been drinking alcohol. Do not ride with someone who has been drinking. When you are tired or distracted. While texting. If you have been using any mind-altering substances or drugs. Wear a helmet and other protective equipment during sports activities. If you have firearms in your house, make sure you follow all gun safety procedures. Seek help if you have been physically or sexually abused. What's next? Visit your health care provider once a year for an annual wellness visit. Ask your health care provider how often you should have your eyes and teeth checked. Stay up to date on all vaccines. This information is not intended to replace advice given to you by your health care provider. Make sure you discuss any questions you have with your health care provider. Document Revised: 09/02/2020 Document Reviewed: 09/02/2020 Elsevier Patient Education  2024 [employer]      Signed,   Reyes Pines, MD [facility], [facility] 04/12/24 11:26 AM      [1] No Known Allergies  "

## 2024-04-13 LAB — TSH: TSH: 1.97 m[IU]/L

## 2024-04-15 ENCOUNTER — Ambulatory Visit: Payer: Self-pay | Admitting: Family Medicine

## 2024-04-25 NOTE — Progress Notes (Signed)
 Lab results have been discussed.   Verbalized understanding? Yes  Are there any questions? No

## 2025-04-14 ENCOUNTER — Encounter: Admitting: Family Medicine
